# Patient Record
Sex: Female | Born: 1955 | Race: White | Hispanic: No | Marital: Married | Smoking: Never smoker
Health system: Southern US, Community
[De-identification: ages and names within clinical notes are randomized; demographics above are authoritative.]

## PROBLEM LIST (undated history)

## (undated) DIAGNOSIS — M810 Age-related osteoporosis without current pathological fracture: Secondary | ICD-10-CM

---

## 2006-06-18 ENCOUNTER — Other Ambulatory Visit: Payer: Self-pay

## 2006-06-18 ENCOUNTER — Emergency Department: Payer: Self-pay | Admitting: Unknown Physician Specialty

## 2009-08-23 ENCOUNTER — Ambulatory Visit: Payer: Self-pay | Admitting: Family Medicine

## 2009-08-27 ENCOUNTER — Ambulatory Visit: Payer: Self-pay | Admitting: Family Medicine

## 2010-03-08 ENCOUNTER — Emergency Department: Payer: Self-pay | Admitting: Emergency Medicine

## 2011-08-18 ENCOUNTER — Ambulatory Visit: Payer: Self-pay | Admitting: Obstetrics and Gynecology

## 2011-10-24 IMAGING — CR DG CHEST 2V
1 series · 2 of 2 positions shown · non-contrast
Comparison: none

REASON FOR EXAM: shortness of breath, pleuritic CP after fall
COMMENTS:

[Series 1: view not recorded · 0.17mm/px · 2 of 2 slices shown]
[im 1/2]
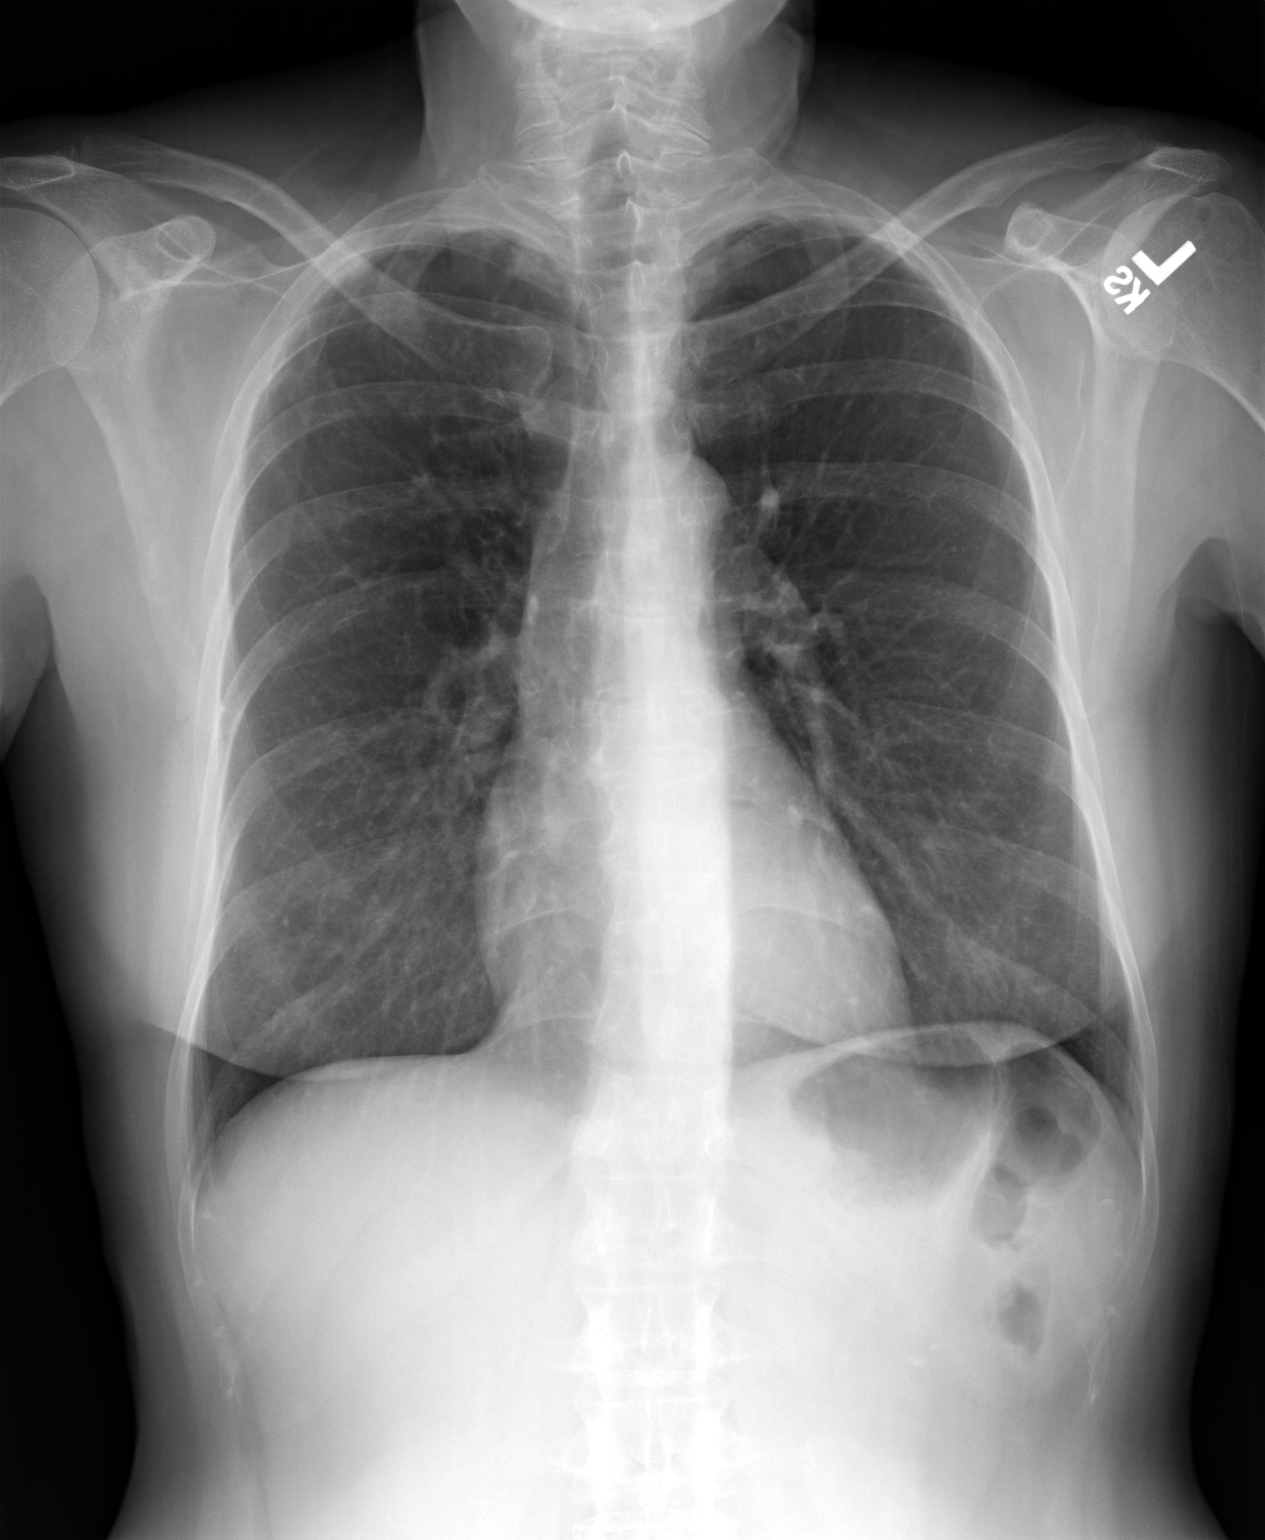
[im 2/2]
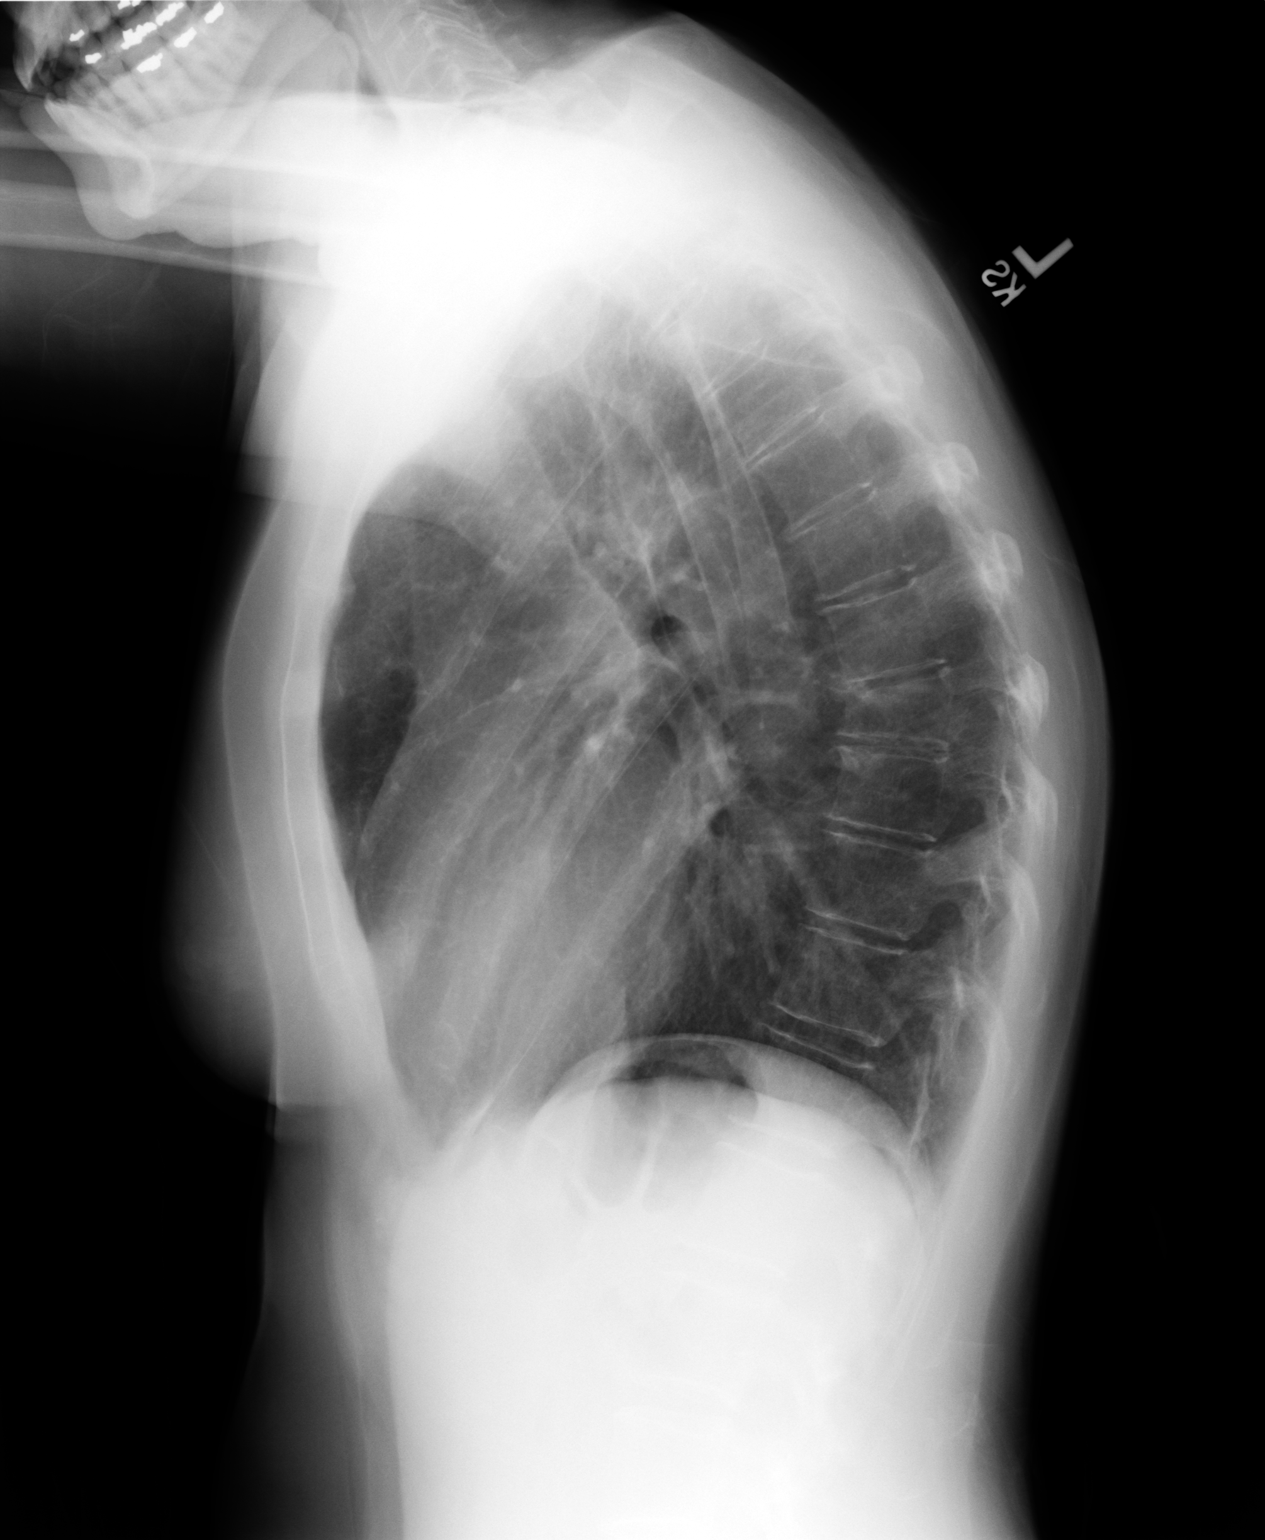

[2 of 2 positions shown; findings below may reference images not displayed]

PROCEDURE:     DXR - DXR CHEST PA (OR AP) AND LATERAL  - March 08, 2010  [DATE]

RESULT:     Two views of the chest are compared to the previous portable
view dated 06/18/2006. The lungs appear fully inflated and clear. There is no
edema, infiltrate, effusion or pneumothorax. The cardiac silhouette and
pulmonary vasculature appear to be normal. Apical fibrosis is present
bilaterally. There is anterior wedging of a midthoracic vertebral body
consistent with compression fracture. This appears to be possibly at the T8
or T7 level. MRI or CT are available for further evaluation if desired.
There does not appear to be significant retropulsion of the posterior wall.
IMPRESSION: 1.     Thoracic spine anterior wedging as described. No acute
cardiopulmonary disease evident.

## 2011-10-24 IMAGING — CT CT CHEST W/O CM
1 series · 16 of 33 positions shown, 20 images · non-contrast
Comparison: none

REASON FOR EXAM: T7 wedge compression fx, retrosternal air space
COMMENTS:

[Series 2: soft tissue · axial · 0.56mm/px · z∈[-326,-26]mm · 16 of 110 slices shown, 20 images]
[im 5/110  mediastinal]
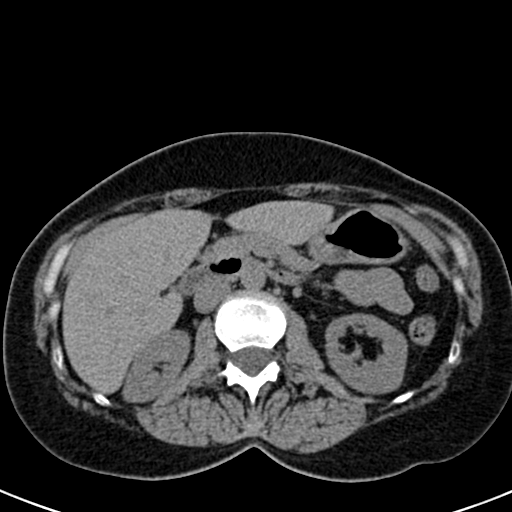
[im 5/110  lung]
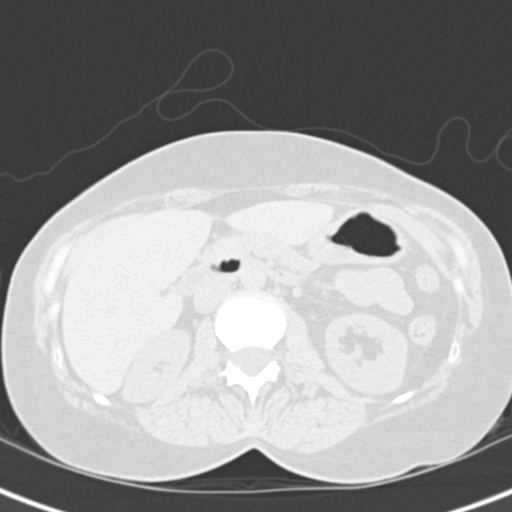
[im 13/110  lung]
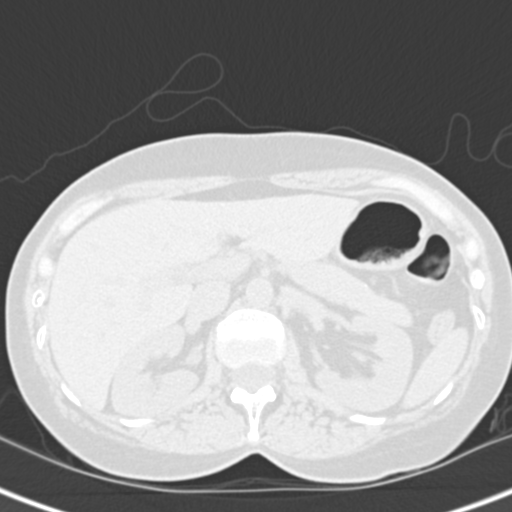
[im 21/110  lung]
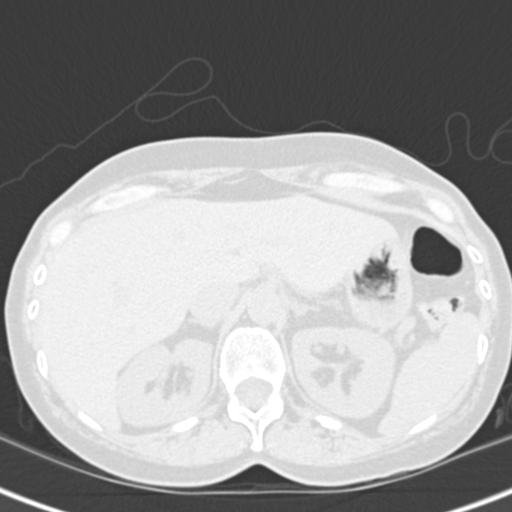
[im 25/110  lung]
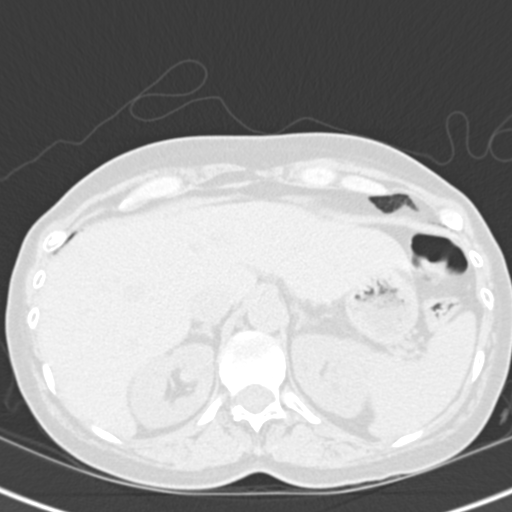
[im 33/110  mediastinal]
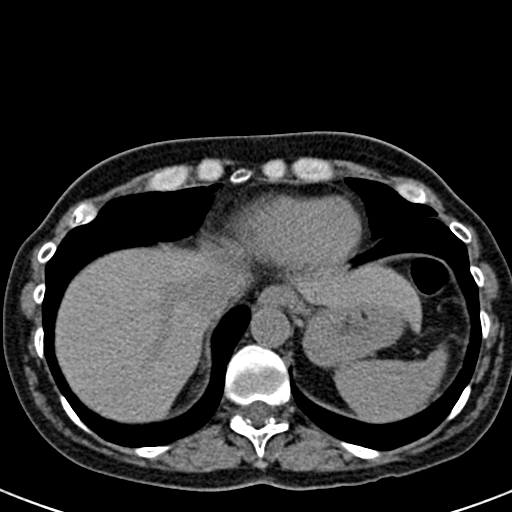
[im 33/110  lung]
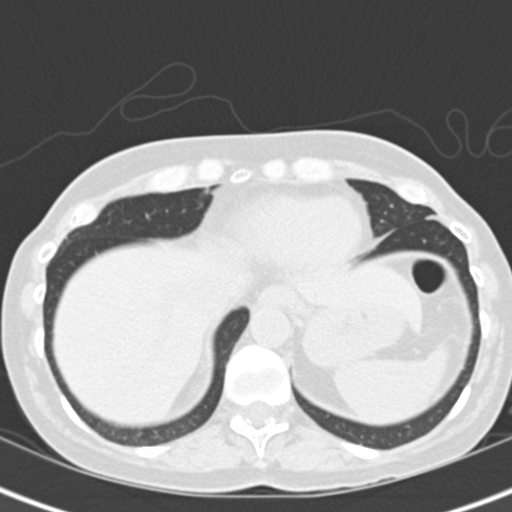
[im 41/110  lung]
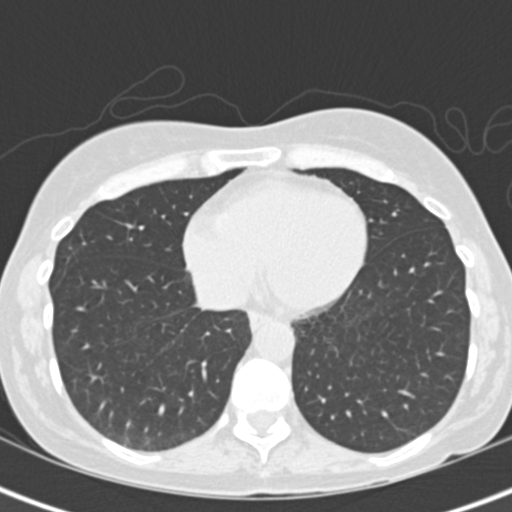
[im 45/110  lung]
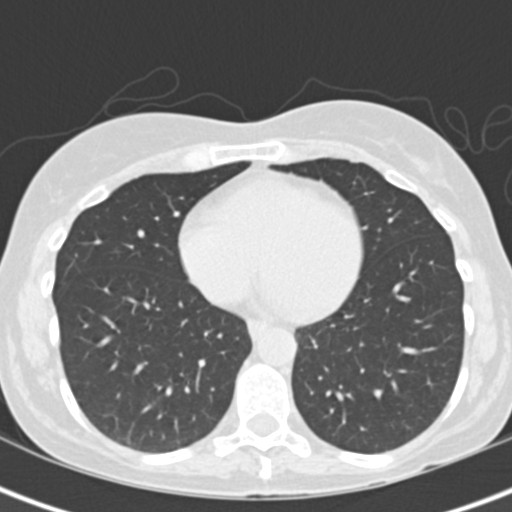
[im 53/110  lung]
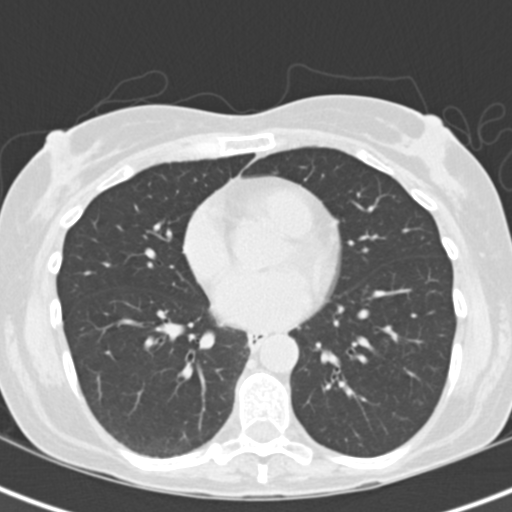
[im 58/110  mediastinal]
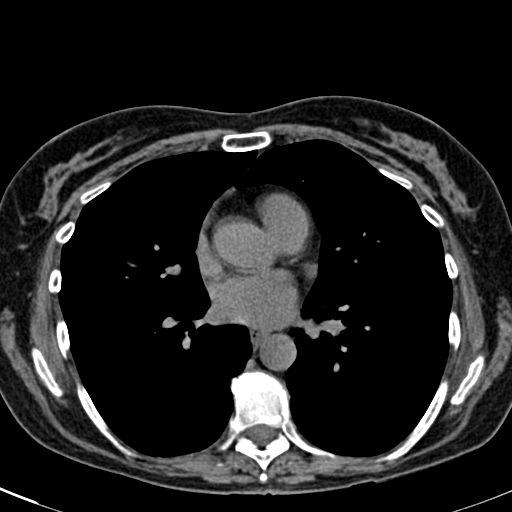
[im 58/110  lung]
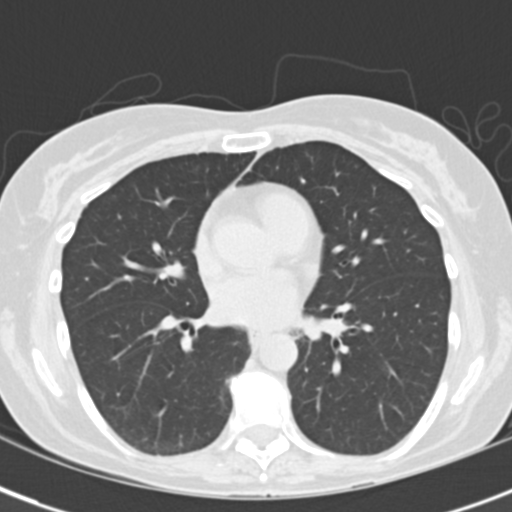
[im 65/110  lung]
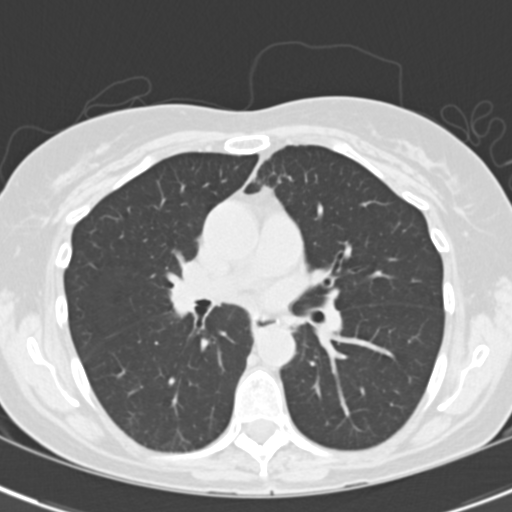
[im 69/110  lung]
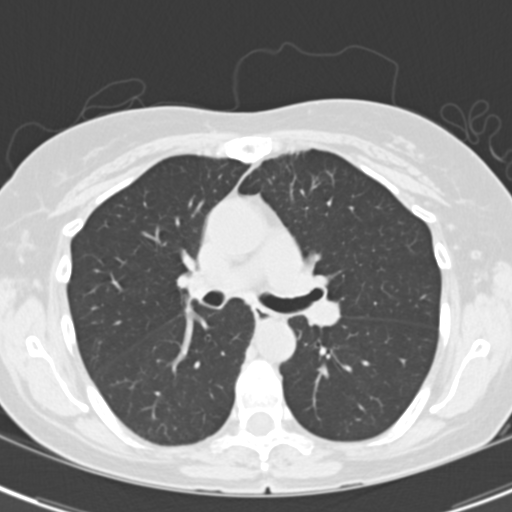
[im 77/110  lung]
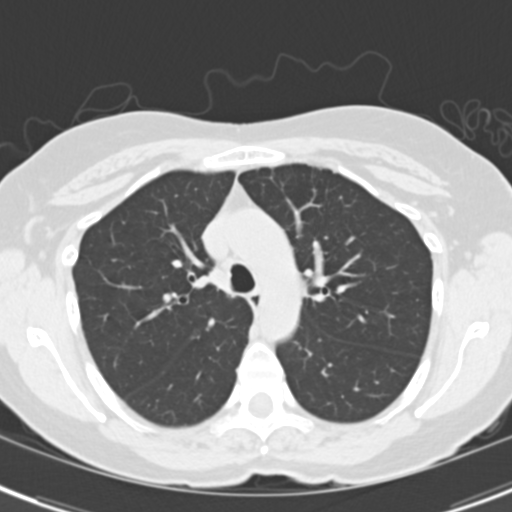
[im 85/110  mediastinal]
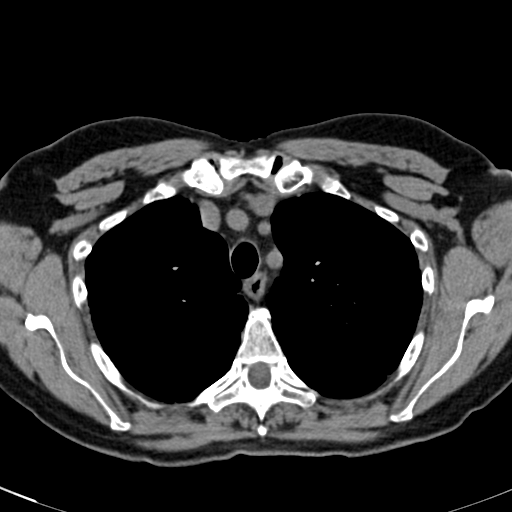
[im 85/110  lung]
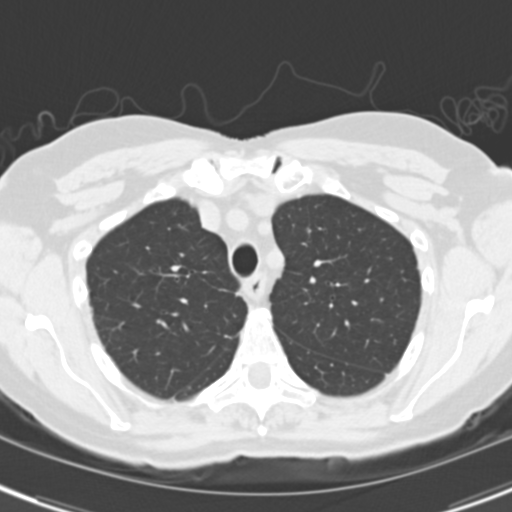
[im 89/110  lung]
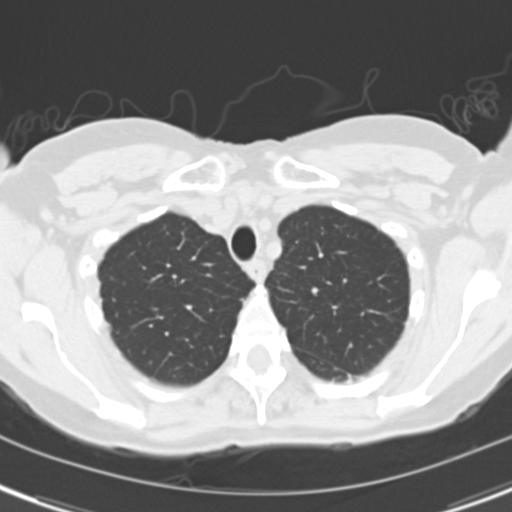
[im 97/110  lung]
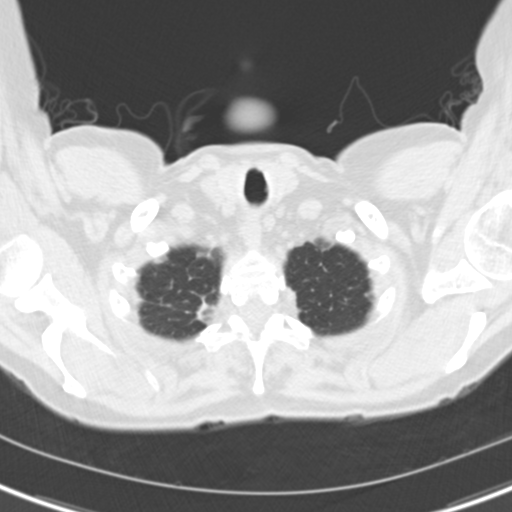
[im 105/110  lung]
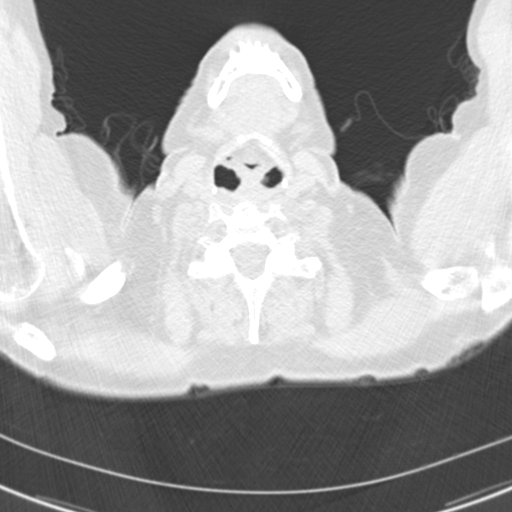

[16 of 33 positions shown; findings below may reference images not displayed]

PROCEDURE:     CT  - CT CHEST WITHOUT CONTRAST  - March 08, 2010  [DATE]

RESULT:     Noncontrast emergent CT of the chest is performed with
reconstructions in the axial plane at 3 mm slice thickness. There is no
previous exam for comparison.

Apical densities most likely represent fibrosis. No pneumothorax is evident.
No pulmonary contusion is appreciated. Webspace reconstructions demonstrate
anterior wedging of the T8 vertebral body without retropulsion of any bony
fragments. Further investigation with CT of the thoracic spine can be
performed. Otherwise the vertebral body heights and intervertebral disc
spaces appear to be maintained. There is no mediastinal or hilar mass. The
thyroid lobes are unremarkable. The included upper abdominal structures
demonstrate left nephrolithiasis with a nonobstructing left mid renal
calculus measuring up to 6.8 mm on image 94.
IMPRESSION: 1. No acute cardiopulmonary disease.
2. T8 compression fracture.

## 2011-10-24 IMAGING — CT CT THORACIC SPINE WITHOUT CONTRAST
2 series · 13 of 27 positions shown, 16 images · non-contrast
Comparison: none

REASON FOR EXAM: T7 wedge compression fx, retrosternal air space
COMMENTS:

[Series 6: tspine · axial · 0.37mm/px · z∈[-314,-38]mm · 8 of 110 slices shown, 10 images]
[im 9/110  soft-tissue]
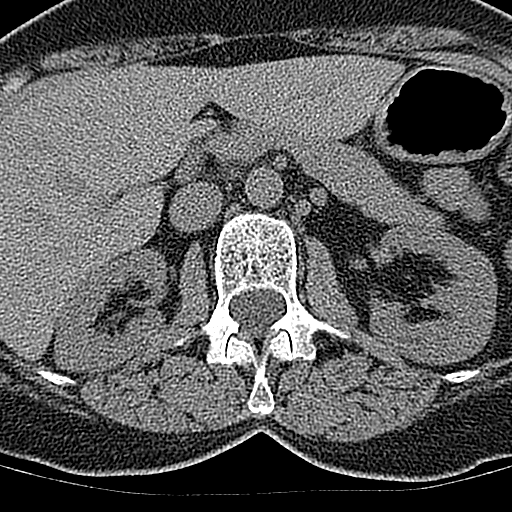
[im 9/110  bone]
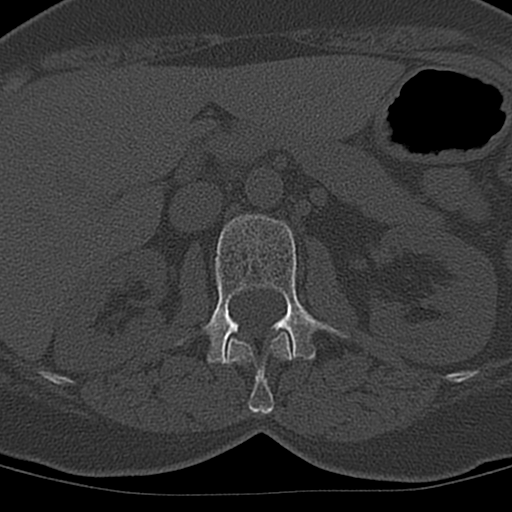
[im 26/110  bone]
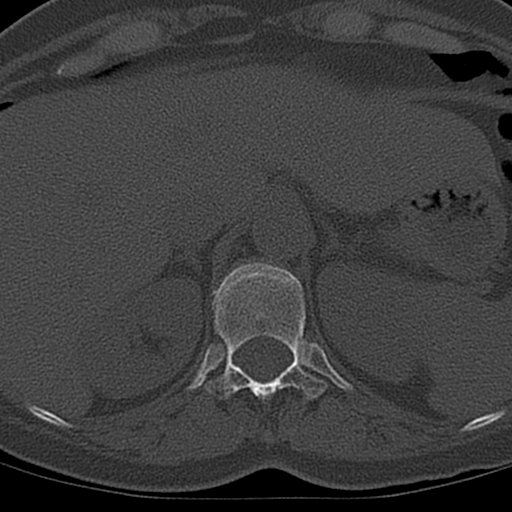
[im 34/110  bone]
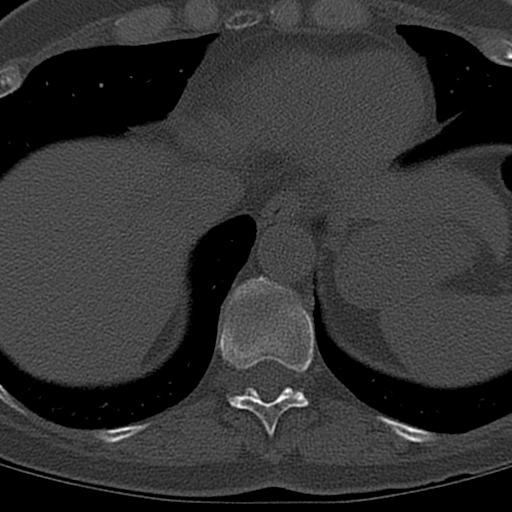
[im 51/110  bone]
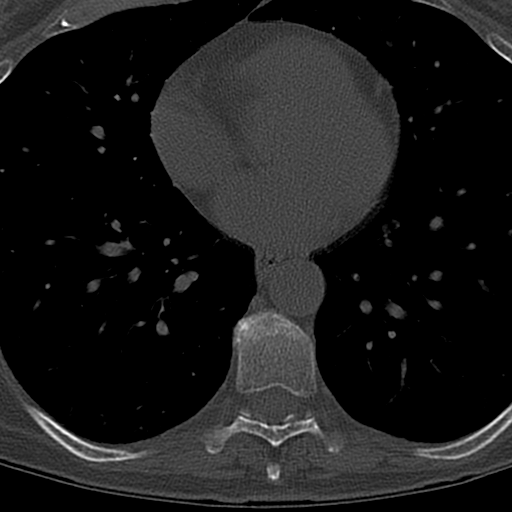
[im 59/110  soft-tissue]
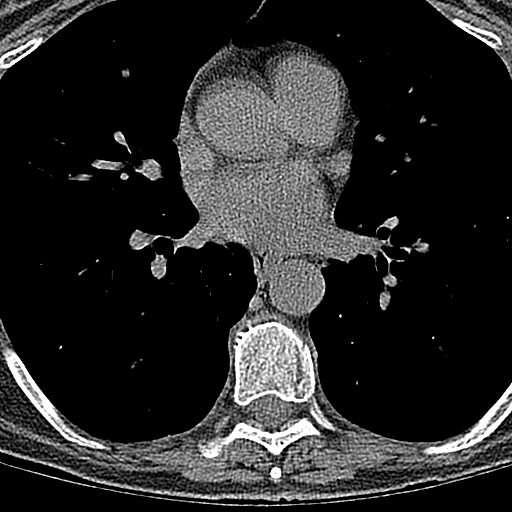
[im 59/110  bone]
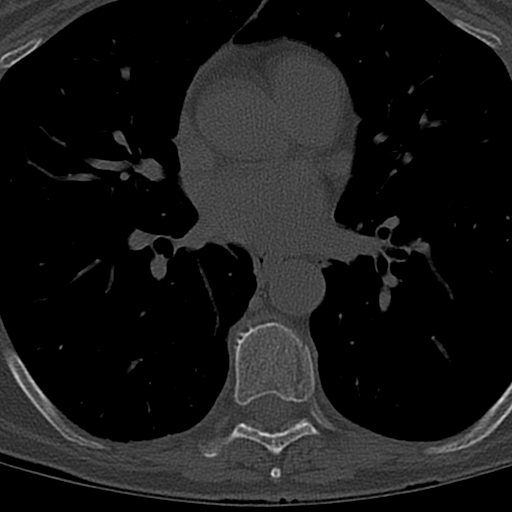
[im 76/110  bone]
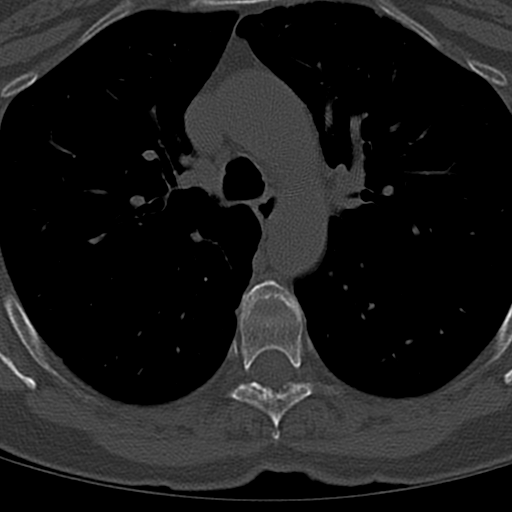
[im 84/110  bone]
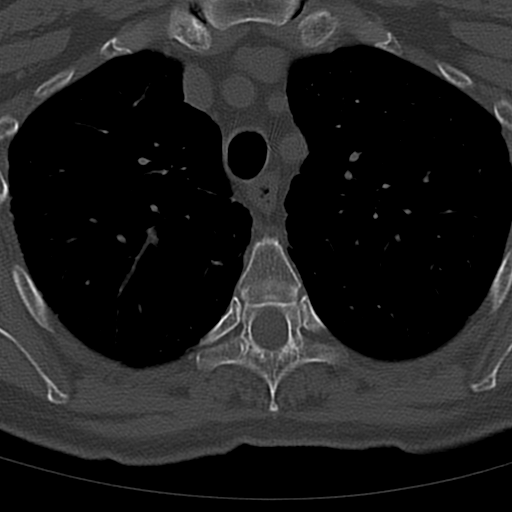
[im 101/110  bone]
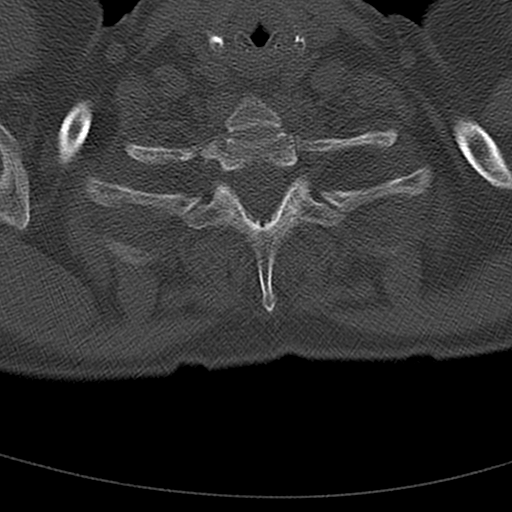

[Series 8: tspine sag · sagittal · 0.38mm/px · 5 of 37 slices shown, 6 images]
[im 13/37  bone]
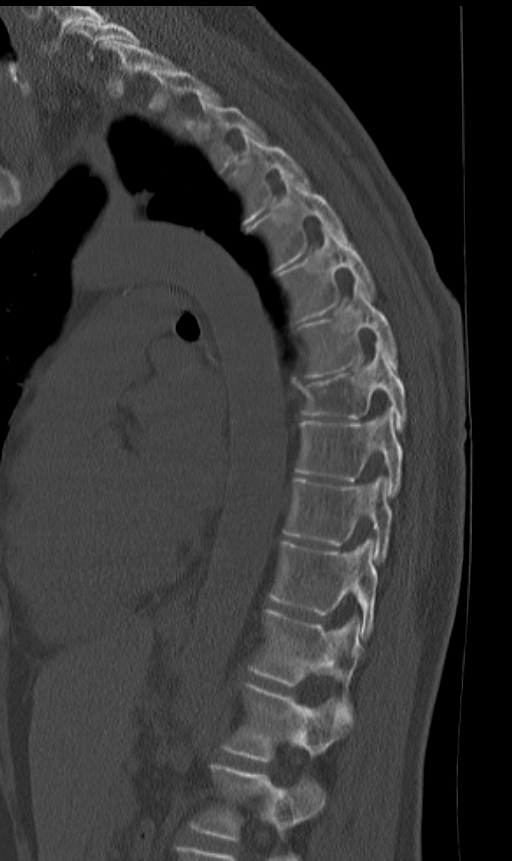
[im 16/37  bone]
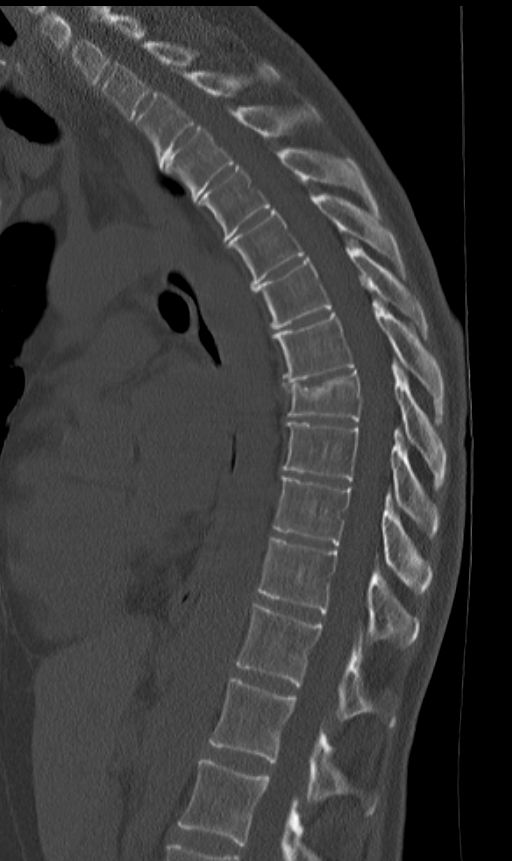
[im 19/37  soft-tissue]
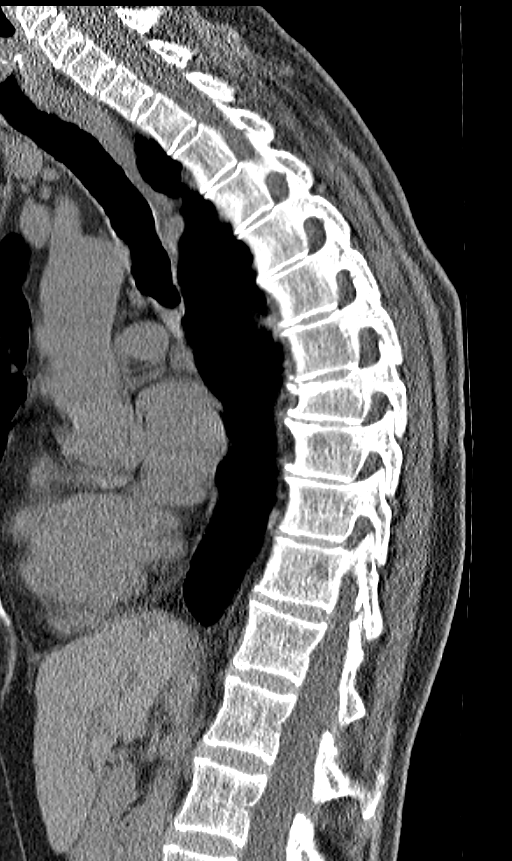
[im 19/37  bone]
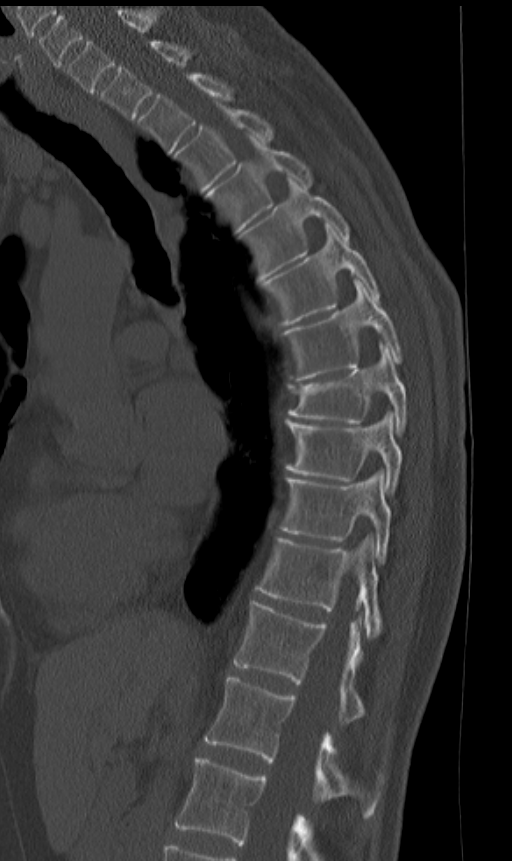
[im 22/37  bone]
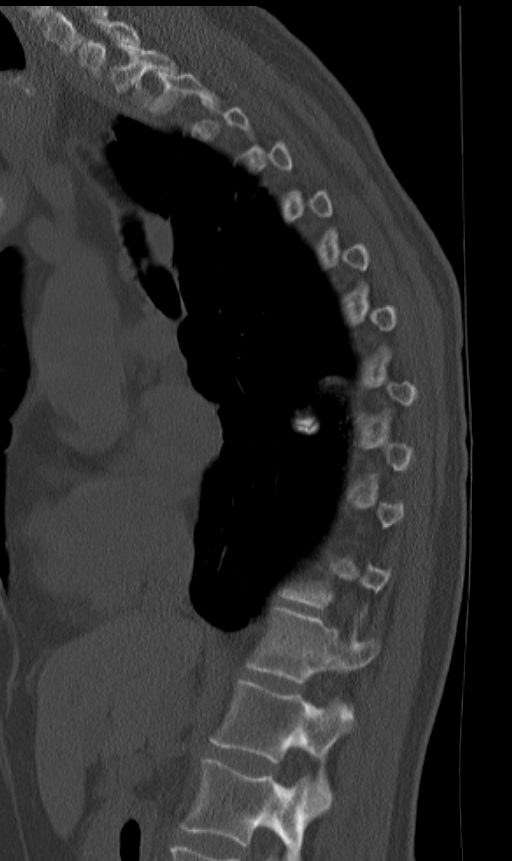
[im 25/37  bone]
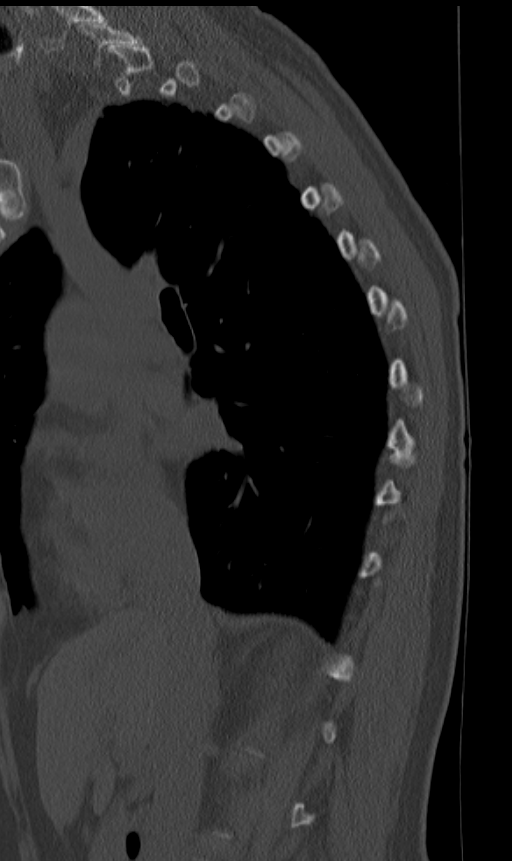

[13 of 27 positions shown; findings below may reference images not displayed]

PROCEDURE:     CT  - CT THORACIC SPINE WO  - March 08, 2010  [DATE]

RESULT:     Axial, sagittal and coronal bone window 3 mm reconstructions
from a CT of the spine in the thoracic region demonstrate anterior wedging
of the T8 vertebral body. This appears to be acute. Minimal retropulsion is
seen along the left side posteriorly without severe spinal canal or
foraminal stenosis appreciated. The included ribs appear intact. The
posterior elements appear to be intact as well.
IMPRESSION: Acute T8 compression fracture without significant evidence
of bony encroachment on the spinal canal.

## 2020-05-21 DIAGNOSIS — M810 Age-related osteoporosis without current pathological fracture: Secondary | ICD-10-CM | POA: Diagnosis not present

## 2020-05-21 DIAGNOSIS — E559 Vitamin D deficiency, unspecified: Secondary | ICD-10-CM | POA: Diagnosis not present

## 2020-12-10 ENCOUNTER — Encounter: Payer: Self-pay | Admitting: Emergency Medicine

## 2020-12-10 ENCOUNTER — Ambulatory Visit
Admission: EM | Admit: 2020-12-10 | Discharge: 2020-12-10 | Disposition: A | Discharge status: Home / Self Care | Payer: BC Managed Care – PPO | Attending: Emergency Medicine | Admitting: Emergency Medicine

## 2020-12-10 ENCOUNTER — Other Ambulatory Visit: Payer: Self-pay

## 2020-12-10 DIAGNOSIS — Z20822 Contact with and (suspected) exposure to covid-19: Secondary | ICD-10-CM | POA: Insufficient documentation

## 2020-12-10 DIAGNOSIS — J029 Acute pharyngitis, unspecified: Secondary | ICD-10-CM | POA: Insufficient documentation

## 2020-12-10 HISTORY — DX: Age-related osteoporosis without current pathological fracture: M81.0

## 2020-12-10 LAB — GROUP A STREP BY PCR: Group A Strep by PCR: NOT DETECTED

## 2020-12-10 LAB — SARS CORONAVIRUS 2 (TAT 6-24 HRS): SARS Coronavirus 2: NEGATIVE

## 2020-12-10 MED ORDER — FLUTICASONE PROPIONATE 50 MCG/ACT NA SUSP: 2.0000 | Freq: Every day | NASAL | 0 refills | Status: AC

## 2020-12-10 NOTE — ED Provider Notes (Signed)
HPI  SUBJECTIVE:  Patient reports sore throat, fatigue, anorexia starting 6 days ago.  Sx worse with nothing.  No alleviating factors.  She has tried Sudafed, NyQuil, DayQuil, and allergy medications. No fever   No neck stiffness  No Cough + Nasal congestion, postnasal drip No Myalgias No Headache No Rash  No loss of taste or smell No shortness of breath or difficulty breathing No nausea, vomiting No diarrhea No abdominal pain     No Recent Strep, mono, flu, COVID exposure No reflux sxs No Allergy sxs  No Breathing difficulty, voice changes, sensation of throat swelling shut No Drooling No Trismus No abx in past month. She got the second COVID-vaccine No antipyretic in past 4-6 hrs  She has no past medical history.  PMD: None   Past Medical History:  Diagnosis Date   Osteoporosis     History reviewed. No pertinent surgical history.  History reviewed. No pertinent family history.  Social History   Tobacco Use   Smoking status: Never   Smokeless tobacco: Never  Vaping Use   Vaping Use: Never used  Substance Use Topics   Alcohol use: Not Currently   Drug use: Not Currently    No current facility-administered medications for this encounter.  Current Outpatient Medications:    alendronate (FOSAMAX) 70 MG tablet, Take 70 mg by mouth once a week., Disp: , Rfl:    ergocalciferol (VITAMIN D2) 1.25 MG (50000 UT) capsule, Take by mouth., Disp: , Rfl:    fluticasone (FLONASE) 50 MCG/ACT nasal spray, Place 2 sprays into both nostrils daily., Disp: 16 g, Rfl: 0  No Known Allergies   ROS  As noted in HPI.   Physical Exam  BP (!) 164/86 (BP Location: Left Arm)   Pulse 80   Temp 98.5 F (36.9 C) (Oral)   Resp 18   Ht 5\' 2"  (1.575 m)   Wt 54.4 kg   SpO2 98%   BMI 21.95 kg/m   Constitutional: Well developed, well nourished, no acute distress Eyes:  EOMI, conjunctiva normal bilaterally HENT: Normocephalic, atraumatic,mucus membranes moist. + mild nasal  congestion, erythematous, but not swollen mucosa.  Erythematous oropharynx.  Tonsils normal size without exudates, uvula midline.  Positive postnasal drip.  Respiratory: Normal inspiratory effort Cardiovascular: Normal rate, no murmurs, rubs, gallops GI: nondistended, nontender. No appreciable splenomegaly skin: No rash, skin intact Lymph: No anterior cervical LN.  No posterior cervical lymphadenopathy Musculoskeletal: no deformities Neurologic: Alert & oriented x 3, no focal neuro deficits Psychiatric: Speech and behavior appropriate.  ED Course   Medications - No data to display  Orders Placed This Encounter  Procedures   Group A Strep by PCR    Standing Status:   Standing    Number of Occurrences:   1   SARS CORONAVIRUS 2 (TAT 6-24 HRS) Nasopharyngeal Nasopharyngeal Swab    Standing Status:   Standing    Number of Occurrences:   1    Order Specific Question:   Is this test for diagnosis or screening    Answer:   Diagnosis of ill patient    Order Specific Question:   Symptomatic for COVID-19 as defined by CDC    Answer:   Yes    Order Specific Question:   Date of Symptom Onset    Answer:   12/05/2020    Order Specific Question:   Hospitalized for COVID-19    Answer:   No    Order Specific Question:   Admitted to ICU for COVID-19  Answer:   No    Order Specific Question:   Previously tested for COVID-19    Answer:   No    Order Specific Question:   Resident in a congregate (group) care setting    Answer:   No    Order Specific Question:   Employed in healthcare setting    Answer:   No    Order Specific Question:   Pregnant    Answer:   No    Order Specific Question:   Has patient completed COVID vaccination(s) (2 doses of Pfizer/Moderna 1 dose of [employer])    Answer:   Yes    Order Specific Question:   Has patient completed COVID Booster / 3rd dose    Answer:   No    Results for orders placed or performed during the hospital encounter of 12/10/20 (from the  past 24 hour(s))  Group A Strep by PCR     Status: None   Collection Time: 12/10/20 12:56 PM   Specimen: Nasopharyngeal Swab; Sterile Swab  Result Value Ref Range   Group A Strep by PCR NOT DETECTED NOT DETECTED   No results found.  ED Clinical Impression  1. Acute pharyngitis, unspecified etiology   2. Encounter for laboratory testing for COVID-19 virus     ED Assessment/Plan  COVID, strep PCR sent.  Unfortunately, she is out of the antiviral treatment window for COVID.  Think that Mono is very unlikely given her age and physical findings.  We talked about doing a mono screen, but decided to not do so. will contact patient [phone] with abnormal results.  Strep PCR negative.  COVID pending at the time of signing of this note  In the meantime, Flonase, Mucinex, advised her to be careful with the decongestants as it did elevate her blood pressure today, saline nasal irrigation with a Lloyd Huger Med rinse and distilled water as often as she wants, Benadryl/Maalox mixture.  Will provide primary care list and order assistance in finding a PMD.  Discussed labs,  MDM, plan and followup with patient. . patient agrees with plan.   Meds ordered this encounter  Medications   fluticasone (FLONASE) 50 MCG/ACT nasal spray    Sig: Place 2 sprays into both nostrils daily.    Dispense:  16 g    Refill:  0     *This clinic note was created using Scientist, clinical (histocompatibility and immunogenetics). Therefore, there may be occasional mistakes despite careful proofreading.     Domenick Gong, MD 12/10/20 1409

## 2020-12-10 NOTE — ED Triage Notes (Signed)
Pt c/o nasal congestion, sore throat, chills, and fatigue. Started about 5 days ago. She had a home covid test and was negative.

## 2020-12-10 NOTE — Discharge Instructions (Addendum)
I will contact you only if your strep or COVID come back positive.  Strep will be back in about an hour, COVID will be back tomorrow.  Do not hear from [place], then you can assume that they are both negative.  1 gram of Tylenol and 600 mg ibuprofen together 3-4 times a day as needed for pain.  Make sure you drink plenty of extra fluids.  Some people find salt water gargles and  Traditional Medicinal's "Throat Coat" tea helpful. Take 5 mL of liquid Benadryl and 5 mL of Maalox. Mix it together, and then hold it in your mouth for as long as you can and then swallow. You may do this 4 times a day.  Flonase, saline nasal irrigation with a NeilMed sinus rinse and distilled water as often as you want.  May take Mucinex or Mucinex D as long as your blood pressure tolerates it.  Here is a list of primary care providers who are taking new patients:  Dr. Elizabeth Sauer [address] [place] [address] [phone]  [facility] [address] [place], [place] [address] [phone]  [facility] [place] [address] [place] [address]  [phone]  [facility] [address] [place], [place] [address] [phone]  [facility] [address] [place]  [phone] [place], [place] [address]  Here are clinics/ other resources who will see you if you do not have insurance. Some have certain criteria that you must meet. Call them and find out what they are:  [facility]: [address] [place], [place] [address] Phone: [phone] Hours: First and Third Saturdays of each Month, 9 a.m. - 1 p.m.  [facility]: [address], [address] [place], [place] [address] Phone: [phone] Hours: Tuesday, 4 p.m. - 8 p.m. Thursday, 1 p.m. - 8 p.m. Wednesday, 9 a.m. - [facility] [address], [place], [place] [address] Phone: [phone] Pharmacy Phone Number: [phone] Dental Phone Number: [phone] [facility] Insurance  Help: [phone]  Dental Hours: Monday - Thursday, 8 a.m. - 6 p.m.  [facility] [address] [place], [place] [address] Phone: [phone] Pharmacy Phone Number: [phone] [facility] Insurance Help: [phone]  [facility] [address] [place], [place] [address] Phone: [phone] Pharmacy Phone Number: [phone] [facility] Insurance Help: [phone]  [facility] [address] [place], [place] [address] Phone: [phone] [facility] Insurance Help: [phone]   [facility] [address] [place], [place] [address] Phone: [phone]  Go to www.goodrx.com  or www.costplusdrugs.com to look up your medications. This will give you a list of where you can find your prescriptions at the most affordable prices. Or ask the pharmacist what the cash price is, or if they have any other discount programs available to help make your medication more affordable. This can be less expensive than what you would pay with insurance.

## 2021-03-14 ENCOUNTER — Ambulatory Visit: Payer: Self-pay | Admitting: Family Medicine

## 2021-05-27 DIAGNOSIS — M810 Age-related osteoporosis without current pathological fracture: Secondary | ICD-10-CM | POA: Diagnosis not present

## 2021-06-10 DIAGNOSIS — M810 Age-related osteoporosis without current pathological fracture: Secondary | ICD-10-CM | POA: Diagnosis not present

## 2021-06-16 ENCOUNTER — Ambulatory Visit: Payer: BC Managed Care – PPO | Admitting: Family Medicine

## 2021-06-16 ENCOUNTER — Encounter: Payer: Self-pay | Admitting: Family Medicine

## 2021-06-16 VITALS — BP 118/84 | HR 64 | Ht 62.0 in | Wt 123.0 lb

## 2021-06-16 DIAGNOSIS — Z124 Encounter for screening for malignant neoplasm of cervix: Secondary | ICD-10-CM | POA: Diagnosis not present

## 2021-06-16 DIAGNOSIS — R030 Elevated blood-pressure reading, without diagnosis of hypertension: Secondary | ICD-10-CM

## 2021-06-16 DIAGNOSIS — Z1211 Encounter for screening for malignant neoplasm of colon: Secondary | ICD-10-CM | POA: Diagnosis not present

## 2021-06-16 NOTE — Progress Notes (Signed)
? ? ?Date:  06/16/2021  ? ?Name:  Julia Washington   DOB:  [birth year]   MRN:  [id number] ? ? ?Chief Complaint: Establish Care (Just needs pcp when getting sick) ? ?Patient is a 66 year old female who presents for a establish care exam. The patient reports the following problems: elevated blood pressure. Health maintenance has been reviewed colonoscopy/ mammogram. ?  ? ? ?No results found for: NA, K, CO2, GLUCOSE, BUN, CREATININE, CALCIUM, EGFR, GFRNONAA, GLUCOSE ?No results found for: CHOL, HDL, LDLCALC, LDLDIRECT, TRIG, CHOLHDL ?No results found for: TSH ?No results found for: HGBA1C ?No results found for: WBC, HGB, HCT, MCV, PLT ?No results found for: ALT, AST, GGT, ALKPHOS, BILITOT ?No results found for: 25OHVITD2, [place], VD25OH  ? ?Review of Systems ? ?There are no problems to display for this patient. ? ? ?No Known Allergies ? ?History reviewed. No pertinent surgical history. ? ?Social History  ? ?Tobacco Use  ? Smoking status: Never  ? Smokeless tobacco: Never  ?Vaping Use  ? Vaping Use: Never used  ?Substance Use Topics  ? Alcohol use: Not Currently  ? Drug use: Not Currently  ? ? ? ?Medication list has been reviewed and updated. ? ?Current Meds  ?Medication Sig  ? alendronate (FOSAMAX) 70 MG tablet Take 70 mg by mouth once a week. [place]  ? Calcium Carbonate-Vit D-Min (CALCIUM 1200 PO) Take 1 tablet by mouth daily.  ? ergocalciferol (VITAMIN D2) 1.25 MG (50000 UT) capsule Take by mouth.  ? fluticasone (FLONASE) 50 MCG/ACT nasal spray Place 2 sprays into both nostrils daily.  ? ? ? ?  06/16/2021  ?  2:22 PM  ?GAD 7 : Generalized Anxiety Score  ?Nervous, Anxious, on Edge 0  ?Control/stop worrying 0  ?Worry too much - different things 0  ?Trouble relaxing 0  ?Restless 0  ?Easily annoyed or irritable 0  ?Afraid - awful might happen 0  ?Total GAD 7 Score 0  ?Anxiety Difficulty Not difficult at all  ? ? ? ?  06/16/2021  ?  2:22 PM  ?Depression screen PHQ 2/9  ?Decreased Interest 0  ?Down, Depressed, Hopeless 0  ?PHQ  - 2 Score 0  ?Altered sleeping 0  ?Tired, decreased energy 0  ?Change in appetite 0  ?Feeling bad or failure about yourself  0  ?Trouble concentrating 0  ?Moving slowly or fidgety/restless 0  ?Suicidal thoughts 0  ?PHQ-9 Score 0  ?Difficult doing work/chores Not difficult at all  ? ? ?BP Readings from Last 3 Encounters:  ?06/16/21 (!) 138/100  ?12/10/20 (!) 164/86  ? ? ?Physical Exam ?Vitals and nursing note reviewed.  ?Constitutional:   ?   Appearance: She is well-developed.  ?HENT:  ?   Head: Normocephalic.  ?   Right Ear: External ear normal.  ?   Left Ear: External ear normal.  ?Eyes:  ?   General: Lids are everted, no foreign bodies appreciated. No scleral icterus.    ?   Left eye: No foreign body or hordeolum.  ?   Conjunctiva/sclera: Conjunctivae normal.  ?   Right eye: Right conjunctiva is not injected.  ?   Left eye: Left conjunctiva is not injected.  ?   Pupils: Pupils are equal, round, and reactive to light.  ?Neck:  ?   Thyroid: No thyromegaly.  ?   Vascular: No JVD.  ?   Trachea: No tracheal deviation.  ?Cardiovascular:  ?   Rate and Rhythm: Normal rate and regular rhythm.  ?  Heart sounds: Normal heart sounds, S1 normal and S2 normal. No murmur heard. ?No systolic murmur is present.  ?No diastolic murmur is present.  ?  No friction rub. No gallop. No S3 or S4 sounds.  ?Pulmonary:  ?   Effort: Pulmonary effort is normal. No respiratory distress.  ?   Breath sounds: Normal breath sounds. No wheezing, rhonchi or rales.  ?Abdominal:  ?   General: Bowel sounds are normal.  ?   Palpations: Abdomen is soft. There is no mass.  ?   Tenderness: There is no abdominal tenderness. There is no guarding or rebound.  ?Musculoskeletal:     ?   General: No tenderness. Normal range of motion.  ?   Cervical back: Normal range of motion and neck supple.  ?Lymphadenopathy:  ?   Cervical: No cervical adenopathy.  ?Skin: ?   General: Skin is warm.  ?   Findings: No rash.  ?Neurological:  ?   Mental Status: She is alert and  oriented to person, place, and time.  ?   Cranial Nerves: No cranial nerve deficit.  ?   Deep Tendon Reflexes: Reflexes normal.  ?Psychiatric:     ?   Mood and Affect: Mood is not anxious or depressed.  ? ? ?Wt Readings from Last 3 Encounters:  ?06/16/21 123 lb (55.8 kg)  ?12/10/20 120 lb (54.4 kg)  ? ? ?BP (!) 138/100   Pulse 64   Ht '5\' 2"'  (1.575 m)   Wt 123 lb (55.8 kg)   BMI 22.50 kg/m?  ? ?Assessment and Plan: ? ?1. Elevated blood pressure reading in office without diagnosis of hypertension ?Chronic.  Controlled.  Stable.  Initial reading was elevated and previous readings have been elevated but normal in other offices.  Repeat today had a normal blood pressure reading 118/84 and we will continue with a dietary approach using the Dash diet or further concern. ? ?2. Colon cancer screening ?Discussed with patient importance of screening for colon cancer.  We discussed Cologuard, FIT testing, and colonoscopy and patient may be leaning more towards colonoscopy since this is a procedure that could not wait to 10 years before another repeat.  Patient is to call her insurance company to determine what her options would be best and we will pursue this referral for her. ? ?3. Cervical cancer screening ?Chronic.  Controlled.  Stable.  Previous history of being followed by Dr. Georgianne Fick in 2013.  If they do not return her to the clinic patient is to call and we will proceed with referral to [facility] or to. ?- Ambulatory referral to Gynecology  ? ? ?

## 2021-06-16 NOTE — Patient Instructions (Signed)

## 2021-06-23 ENCOUNTER — Telehealth: Payer: Self-pay

## 2021-06-23 NOTE — Telephone Encounter (Signed)
-----   Message from Darrel Hoover, RN sent at 06/19/2021  9:01 AM EDT ----- Regarding: referral Schedule with PA or CNM for annual exam including pap - 4-6 weeks.

## 2021-06-23 NOTE — Telephone Encounter (Signed)
[facility] referring for annual exam including pap smear . Sch with CMN or PA Called and left voicemail for patient to call back to be scheduled.

## 2021-06-25 NOTE — Telephone Encounter (Signed)
Patient is scheduled for 08/20/21 w LMD

## 2021-08-19 DIAGNOSIS — M810 Age-related osteoporosis without current pathological fracture: Secondary | ICD-10-CM | POA: Diagnosis not present

## 2021-08-20 ENCOUNTER — Encounter: Payer: Self-pay | Admitting: Licensed Practical Nurse

## 2021-08-25 DIAGNOSIS — M810 Age-related osteoporosis without current pathological fracture: Secondary | ICD-10-CM | POA: Diagnosis not present

## 2021-08-26 ENCOUNTER — Encounter: Payer: Self-pay | Admitting: Licensed Practical Nurse

## 2021-10-22 ENCOUNTER — Ambulatory Visit (INDEPENDENT_AMBULATORY_CARE_PROVIDER_SITE_OTHER): Payer: BC Managed Care – PPO | Admitting: Licensed Practical Nurse

## 2021-10-22 VITALS — BP 122/70 | Ht 62.0 in | Wt 122.0 lb

## 2021-10-22 DIAGNOSIS — Z124 Encounter for screening for malignant neoplasm of cervix: Secondary | ICD-10-CM | POA: Diagnosis not present

## 2021-10-22 DIAGNOSIS — Z01419 Encounter for gynecological examination (general) (routine) without abnormal findings: Secondary | ICD-10-CM

## 2021-10-22 NOTE — Progress Notes (Signed)
Gynecology Annual Exam  PCP: Duanne Limerick, MD  Chief Complaint:  Chief Complaint  Patient presents with  . Annual Exam    History of Present Illness:Patient is a 66 y.o. No obstetric history on file. presents for annual exam. The patient has no complaints today.   LMP: No LMP recorded. Patient is postmenopausal. Menarche:{numbers 0-25:42706} Average Interval: {Desc; regular/irreg:14544}, {numbers 22-35:14824} days Duration of flow: {numbers; 0-10:33138} days Heavy Menses: {yes/no:63} Clots: {yes/no:63} Intermenstrual Bleeding: {yes/no:63} Postcoital Bleeding: {yes/no:63} Dysmenorrhea: {yes/no:63}  The patient {sys sexually active:13135} sexually active. She {has/denies:315300} dyspareunia.  The patient {DOES_DOES CBJ:62831} perform self breast exams.  There {is/is no:19420} notable family history of breast or ovarian cancer in her family.  The patient wears seatbelts: {yes/no:63}.   The patient has regular exercise: {yes/no/not asked:9010}.    The patient {Blank single:19197::"reports","denies"} current symptoms of depression.     Review of Systems: ROS  Past Medical History:  There are no problems to display for this patient.   Past Surgical History:  No past surgical history on file.  Gynecologic History:  No LMP recorded. Patient is postmenopausal. Last Pap: Results were: *** {Findings; lab pap smear results:16707::"NIL and HR HPV+","NIL and HR HPV negative"}  Last mammogram: *** Results were: {Blank single:19197::"***","BI-RADS IV","BI-RAD III","BI-RAD II","BI-RAD I"}  Obstetric History: No obstetric history on file.  Family History:  Family History  Problem Relation Age of Onset  . Cancer Mother   . Cancer Father   . Cancer Sister   . Stroke Maternal Grandmother   . Heart disease Paternal Grandfather     Social History:  Social History   Socioeconomic History  . Marital status: Married    Spouse name: Not on file  . Number of children: Not on  file  . Years of education: Not on file  . Highest education level: Not on file  Occupational History  . Not on file  Tobacco Use  . Smoking status: Never  . Smokeless tobacco: Never  Vaping Use  . Vaping Use: Never used  Substance and Sexual Activity  . Alcohol use: Not Currently  . Drug use: Not Currently  . Sexual activity: Yes  Other Topics Concern  . Not on file  Social History Narrative  . Not on file   Social Determinants of Health   Financial Resource Strain: Not on file  Food Insecurity: Not on file  Transportation Needs: Not on file  Physical Activity: Not on file  Stress: Not on file  Social Connections: Not on file  Intimate Partner Violence: Not on file    Allergies:  No Known Allergies  Medications: Prior to Admission medications   Medication Sig Start Date End Date Taking? Authorizing Provider  Calcium Carbonate-Vit D-Min (CALCIUM 1200 PO) Take 1 tablet by mouth daily.   Yes [provider]  ergocalciferol (VITAMIN D2) 1.25 MG (50000 UT) capsule Take by mouth.   Yes [provider]  fluticasone (FLONASE) 50 MCG/ACT nasal spray Place 2 sprays into both nostrils daily. 12/10/20  Yes Domenick Gong, MD    Physical Exam Vitals: Blood pressure 122/70, height 5\' 2"  (1.575 m), weight 122 lb (55.3 kg).  General: NAD HEENT: normocephalic, anicteric Thyroid: no enlargement, no palpable nodules Pulmonary: No increased work of breathing, CTAB Cardiovascular: RRR, distal pulses 2+ Breast: Breast symmetrical, no tenderness, no palpable nodules or masses, no skin or nipple retraction present, no nipple discharge.  No axillary or supraclavicular lymphadenopathy. Abdomen: NABS, soft, non-tender, non-distended.  Umbilicus without lesions.  No hepatomegaly, splenomegaly or masses palpable. No evidence of hernia  Genitourinary:  External: Normal external female genitalia.  Normal urethral meatus, normal Bartholin's and Skene's glands.    Vagina:  Normal vaginal mucosa, no evidence of prolapse.    Cervix: Grossly normal in appearance, no bleeding  Uterus: Non-enlarged, mobile, normal contour.  No CMT  Adnexa: ovaries non-enlarged, no adnexal masses  Rectal: deferred  Lymphatic: no evidence of inguinal lymphadenopathy Extremities: no edema, erythema, or tenderness Neurologic: Grossly intact Psychiatric: mood appropriate, affect full  Female chaperone present for pelvic and breast  portions of the physical exam     Assessment: 66 y.o. No obstetric history on file. routine annual exam  Plan: Problem List Items Addressed This Visit   None Visit Diagnoses     Well woman exam    -  Primary   Relevant Orders   MM DIGITAL SCREENING BILATERAL       1) Mammogram - recommend yearly screening mammogram.  Mammogram {Blank single:19197::"Is up to date","Was ordered today"}  2) STI screening  {Blank single:19197::"was","was not"}offered and {Blank single:19197::"accepted","declined","therefore not obtained"}  3) ASCCP guidelines and rational discussed.  Patient opts for {Blank single:19197::"***","every 5 years","every 3 years","yearly","discontinue age >65","discontinue secondary to prior hysterectomy"} screening interval  4) Osteoporosis  - per USPTF routine screening DEXA at age 61 - FRAX 10 year major fracture risk ***,  10 year hip fracture risk ***  Consider FDA-approved medical therapies in postmenopausal women and men aged 73 years and older, based on the following: a) A hip or vertebral (clinical or morphometric) fracture b) T-score = -2.5 at the femoral neck or spine after appropriate evaluation to exclude secondary causes C) Low bone mass (T-score between -1.0 and -2.5 at the femoral neck or spine) and a 10-year probability of a hip fracture = 3% or a 10-year probability of a major osteoporosis-related fracture = 20% based on the [place]-adapted WHO algorithm   5) Routine healthcare maintenance including cholesterol,  diabetes screening discussed {Blank single:19197::"managed by PCP","Ordered today","To return fasting at a later date","Declines"}  6) Colonoscopy ***.  Screening recommended starting at age 54 for average risk individuals, age 22 for individuals deemed at increased risk (including African Americans) and recommended to continue until age 5.  For patient age 22-85 individualized approach is recommended.  Gold standard screening is via colonoscopy, Cologuard screening is an acceptable alternative for patient unwilling or unable to undergo colonoscopy.  "Colorectal cancer screening for average-risk adults: 2018 guideline update from the American Cancer Society"CA: A Cancer Journal for Clinicians: Aug 12, 2016   7) No follow-ups on file.   Carie Caddy, CNM  Domingo Pulse, [facility] 10/22/2021, 2:53 PM

## 2021-10-23 ENCOUNTER — Other Ambulatory Visit (HOSPITAL_COMMUNITY)
Admission: RE | Admit: 2021-10-23 | Discharge: 2021-10-23 | Disposition: A | Discharge status: Home / Self Care | Payer: BC Managed Care – PPO | Source: Ambulatory Visit | Attending: Licensed Practical Nurse | Admitting: Licensed Practical Nurse

## 2021-10-23 DIAGNOSIS — Z124 Encounter for screening for malignant neoplasm of cervix: Secondary | ICD-10-CM | POA: Diagnosis not present

## 2021-10-23 DIAGNOSIS — Z01419 Encounter for gynecological examination (general) (routine) without abnormal findings: Secondary | ICD-10-CM | POA: Insufficient documentation

## 2021-10-28 LAB — CYTOLOGY - PAP
Chlamydia: NEGATIVE
Comment: NEGATIVE
Comment: NEGATIVE
Comment: NEGATIVE
Comment: NORMAL
Diagnosis: NEGATIVE
Diagnosis: REACTIVE
High risk HPV: NEGATIVE
Neisseria Gonorrhea: NEGATIVE
Trichomonas: NEGATIVE

## 2022-03-02 DIAGNOSIS — M810 Age-related osteoporosis without current pathological fracture: Secondary | ICD-10-CM | POA: Diagnosis not present

## 2022-03-10 DIAGNOSIS — M810 Age-related osteoporosis without current pathological fracture: Secondary | ICD-10-CM | POA: Diagnosis not present

## 2022-05-19 DIAGNOSIS — M810 Age-related osteoporosis without current pathological fracture: Secondary | ICD-10-CM | POA: Diagnosis not present

## 2022-08-04 DIAGNOSIS — S43005A Unspecified dislocation of left shoulder joint, initial encounter: Secondary | ICD-10-CM | POA: Diagnosis not present

## 2022-08-04 DIAGNOSIS — M79602 Pain in left arm: Secondary | ICD-10-CM | POA: Diagnosis not present

## 2022-08-04 DIAGNOSIS — S43015A Anterior dislocation of left humerus, initial encounter: Secondary | ICD-10-CM | POA: Diagnosis not present

## 2022-08-04 DIAGNOSIS — M25512 Pain in left shoulder: Secondary | ICD-10-CM | POA: Diagnosis not present

## 2022-08-04 DIAGNOSIS — S42292A Other displaced fracture of upper end of left humerus, initial encounter for closed fracture: Secondary | ICD-10-CM | POA: Diagnosis not present

## 2022-08-05 ENCOUNTER — Telehealth: Payer: Self-pay

## 2022-08-05 DIAGNOSIS — S42292A Other displaced fracture of upper end of left humerus, initial encounter for closed fracture: Secondary | ICD-10-CM | POA: Diagnosis not present

## 2022-08-05 DIAGNOSIS — S43005A Unspecified dislocation of left shoulder joint, initial encounter: Secondary | ICD-10-CM | POA: Diagnosis not present

## 2022-08-05 NOTE — Transitions of Care (Post Inpatient/ED Visit) (Signed)
   08/05/2022  Name: Julia [place] MRN: [id number] DOB: 1955/04/26  Today's TOC FU Call Status: Today's TOC FU Call Status:: Successful TOC FU Call Competed TOC FU Call Complete Date: 08/05/22  Transition Care Management Follow-up Telephone Call Date of Discharge: 08/04/22 Name of Other (Non-[facility]) Discharge Facility: [facility] Type of Discharge: Emergency Department Reason for ED Visit: Orthopedic Conditions (dislocated shoulder) Orthopedic/Injury Diagnosis:  (dislocated shoulder) How have you been since you were released from the hospital?: Better Any questions or concerns?: No  Items Reviewed: Did you receive and understand the discharge instructions provided?: Yes Medications obtained,verified, and reconciled?: Yes (Medications Reviewed) Any new allergies since your discharge?: No Dietary orders reviewed?: NA Do you have support at home?: Yes People in Home: spouse Name of Support/Comfort Primary Source: Eddie  Medications Reviewed Today: Medications Reviewed Today     Reviewed by Everitt Amber (Medical Assistant) on 08/05/22 at 1112  Med List Status: <None>   Medication Order Taking? Sig Documenting Provider Last Dose Status Informant  Calcium Carbonate-Vit D-Min (CALCIUM 1200 PO) [id number] Yes Take 1 tablet by mouth daily. [provider] Taking Active   ergocalciferol (VITAMIN D2) 1.25 MG (50000 UT) capsule [id number] Yes Take by mouth. [provider] Taking Active   fluticasone (FLONASE) 50 MCG/ACT nasal spray [id number] Yes Place 2 sprays into both nostrils daily. Domenick Gong, MD Taking Active             Home Care and Equipment/Supplies: Were Home Health Services Ordered?: NA Any new equipment or medical supplies ordered?: Yes (sling to wear) Were you able to get the equipment/medical supplies?: Yes Do you have any questions related to the use of the equipment/supplies?: No  Functional Questionnaire: Do you need assistance with  bathing/showering or dressing?: No Do you need assistance with meal preparation?: No Do you need assistance with eating?: No Do you have difficulty maintaining continence: No Do you need assistance with getting out of bed/getting out of a chair/moving?: No Do you have difficulty managing or taking your medications?: No  Follow up appointments reviewed: PCP Follow-up appointment confirmed?: Yes Date of PCP follow-up appointment?: 08/11/22 Follow-up Provider: [facility] Follow-up appointment confirmed?: NA Do you need transportation to your follow-up appointment?: No Do you understand care options if your condition(s) worsen?: Yes-patient verbalized understanding    SIGNATURE Arthur Holms

## 2022-08-05 NOTE — Transitions of Care (Post Inpatient/ED Visit) (Signed)
   08/05/2022  Name: SHANNYN [place] MRN: [id number] DOB: 1955/06/29  Today's TOC FU Call Status: Today's TOC FU Call Status:: Unsuccessul Call (1st Attempt) Unsuccessful Call (1st Attempt) Date: 08/05/22  Attempted to reach the patient regarding the most recent Inpatient/ED visit.  Follow Up Plan: Additional outreach attempts will be made to reach the patient to complete the Transitions of Care (Post Inpatient/ED visit) call.   Signature Arthur Holms

## 2022-08-11 ENCOUNTER — Ambulatory Visit (INDEPENDENT_AMBULATORY_CARE_PROVIDER_SITE_OTHER): Payer: Medicare HMO | Admitting: Family Medicine

## 2022-08-11 ENCOUNTER — Encounter: Payer: Self-pay | Admitting: Family Medicine

## 2022-08-11 VITALS — BP 118/72 | HR 60 | Ht 62.0 in | Wt 118.0 lb

## 2022-08-11 DIAGNOSIS — S43302D Subluxation of unspecified parts of left shoulder girdle, subsequent encounter: Secondary | ICD-10-CM

## 2022-08-11 DIAGNOSIS — S43302S Subluxation of unspecified parts of left shoulder girdle, sequela: Secondary | ICD-10-CM | POA: Diagnosis not present

## 2022-08-11 NOTE — Progress Notes (Signed)
Date:  08/11/2022   Name:  Julia Washington   DOB:  January 03, 1956   MRN:  [id number]   Chief Complaint: ER follow up (ER on 5/21. TOC on 08/05/22- follow up with ortho this Friday- wearing sling until then for L) shoulder)  Shoulder Injury  Incident location: at grandchild event. The left shoulder is affected. The incident occurred 5 to 7 days ago. 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 The quality of the pain is described as aching. The pain does not radiate. The pain is mild. Pertinent negatives include no chest pain.    No results found for: "NA", "K", "CO2", "GLUCOSE", "BUN", "CREATININE", "CALCIUM", "EGFR", "GFRNONAA" No results found for: "CHOL", "HDL", "LDLCALC", "LDLDIRECT", "TRIG", "CHOLHDL" No results found for: "TSH" No results found for: "HGBA1C" No results found for: "WBC", "HGB", "HCT", "MCV", "PLT" No results found for: "ALT", "AST", "GGT", "ALKPHOS", "BILITOT" No results found for: "25OHVITD2", "25OHVITD3", "VD25OH"   Review of Systems  Respiratory:  Negative for chest tightness and wheezing.   Cardiovascular:  Negative for chest pain and palpitations.  Musculoskeletal:  Positive for arthralgias. Negative for back pain, joint swelling, myalgias and neck stiffness.    There are no problems to display for this patient.   No Known Allergies  No past surgical history on file.  Social History   Tobacco Use   Smoking status: Never   Smokeless tobacco: Never  Vaping Use   Vaping Use: Never used  Substance Use Topics   Alcohol use: Not Currently   Drug use: Not Currently     Medication list has been reviewed and updated.  Current Meds  Medication Sig   Calcium Carbonate-Vit D-Min (CALCIUM 1200 PO) Take 1 tablet by mouth daily.   ergocalciferol (VITAMIN D2) 1.25 MG (50000 UT) capsule Take by mouth.   fluticasone (FLONASE) 50 MCG/ACT nasal spray Place 2 sprays into both nostrils daily.       08/11/2022    2:45 PM 06/16/2021    2:22 PM  GAD  7 : Generalized Anxiety Score  Nervous, Anxious, on Edge 0 0  Control/stop worrying 0 0  Worry too much - different things 0 0  Trouble relaxing 0 0  Restless 0 0  Easily annoyed or irritable 0 0  Afraid - awful might happen 0 0  Total GAD 7 Score 0 0  Anxiety Difficulty Not difficult at all Not difficult at all       08/11/2022    2:45 PM 06/16/2021    2:22 PM  Depression screen PHQ 2/9  Decreased Interest 0 0  Down, Depressed, Hopeless 0 0  PHQ - 2 Score 0 0  Altered sleeping 0 0  Tired, decreased energy 0 0  Change in appetite 0 0  Feeling bad or failure about yourself  0 0  Trouble concentrating 0 0  Moving slowly or fidgety/restless 0 0  Suicidal thoughts 0 0  PHQ-9 Score 0 0  Difficult doing work/chores Not difficult at all Not difficult at all    BP Readings from Last 3 Encounters:  08/11/22 118/72  10/22/21 122/70  06/16/21 118/84    Physical Exam Vitals and nursing note reviewed.  HENT:     Right Ear: Tympanic membrane and ear canal normal.     Left Ear: Tympanic membrane and ear canal normal.  Cardiovascular:     Heart sounds: No murmur heard.    No gallop.  Pulmonary:     Breath sounds: No wheezing or  rhonchi.  Musculoskeletal:     Left shoulder: Tenderness present.     Cervical back: Normal range of motion.     Comments: Tender anterior/distal clavicle  Neurological:     Mental Status: She is alert.     Wt Readings from Last 3 Encounters:  08/11/22 118 lb (53.5 kg)  10/22/21 122 lb (55.3 kg)  06/16/21 123 lb (55.8 kg)    BP 118/72   Pulse 60   Ht 5\' 2"  (1.575 m)   Wt 118 lb (53.5 kg)   SpO2 97%   BMI 21.58 kg/m   Assessment and Plan: 1. Subluxation of left shoulder girdle, sequela Chronic.  Controlled.  Stable.  Patient may have had an injury in the distant past with no issues but recently had an event that she partially subluxed her left shoulder and then was able to return to stability.  However approximately a week ago she tried to  clap over her head and there was a dislocation anteriorly with inability to spontaneously return to neutrality.  Patient was seen in the ER patient says she was able to manipulate it back into position at the ER where it has been in a sling since.  During the course it was noted that there was an impact fracture of the humerus and patient will be reevaluated by orthopedics.  On examination today she is tender over the distal clavicle and anterior aspect and is interested in physical therapy if okayed by orthopedics to build up muscle to help stabilize left shoulder girdle.  In the meantime she will continue in sling until she is reevaluated and will take her ibuprofen/Tylenol on as-needed basis.    Elizabeth Sauer, MD

## 2022-08-14 DIAGNOSIS — S43005S Unspecified dislocation of left shoulder joint, sequela: Secondary | ICD-10-CM | POA: Diagnosis not present

## 2022-08-14 DIAGNOSIS — M25512 Pain in left shoulder: Secondary | ICD-10-CM | POA: Diagnosis not present

## 2022-08-14 DIAGNOSIS — M21822 Other specified acquired deformities of left upper arm: Secondary | ICD-10-CM | POA: Diagnosis not present

## 2022-08-14 DIAGNOSIS — S43005A Unspecified dislocation of left shoulder joint, initial encounter: Secondary | ICD-10-CM | POA: Diagnosis not present

## 2022-08-14 DIAGNOSIS — S43005D Unspecified dislocation of left shoulder joint, subsequent encounter: Secondary | ICD-10-CM | POA: Diagnosis not present

## 2022-09-07 DIAGNOSIS — M24412 Recurrent dislocation, left shoulder: Secondary | ICD-10-CM | POA: Diagnosis not present

## 2022-09-07 DIAGNOSIS — M25512 Pain in left shoulder: Secondary | ICD-10-CM | POA: Diagnosis not present

## 2022-09-14 DIAGNOSIS — M24412 Recurrent dislocation, left shoulder: Secondary | ICD-10-CM | POA: Diagnosis not present

## 2022-09-14 DIAGNOSIS — M25512 Pain in left shoulder: Secondary | ICD-10-CM | POA: Diagnosis not present

## 2022-09-15 DIAGNOSIS — M810 Age-related osteoporosis without current pathological fracture: Secondary | ICD-10-CM | POA: Diagnosis not present

## 2022-09-21 DIAGNOSIS — M25512 Pain in left shoulder: Secondary | ICD-10-CM | POA: Diagnosis not present

## 2022-09-21 DIAGNOSIS — M24412 Recurrent dislocation, left shoulder: Secondary | ICD-10-CM | POA: Diagnosis not present

## 2022-09-23 DIAGNOSIS — M24412 Recurrent dislocation, left shoulder: Secondary | ICD-10-CM | POA: Diagnosis not present

## 2022-09-23 DIAGNOSIS — M25512 Pain in left shoulder: Secondary | ICD-10-CM | POA: Diagnosis not present

## 2022-09-28 DIAGNOSIS — S43005D Unspecified dislocation of left shoulder joint, subsequent encounter: Secondary | ICD-10-CM | POA: Diagnosis not present

## 2022-09-28 DIAGNOSIS — M24412 Recurrent dislocation, left shoulder: Secondary | ICD-10-CM | POA: Diagnosis not present

## 2022-09-28 DIAGNOSIS — M25512 Pain in left shoulder: Secondary | ICD-10-CM | POA: Diagnosis not present

## 2022-09-28 DIAGNOSIS — M21822 Other specified acquired deformities of left upper arm: Secondary | ICD-10-CM | POA: Diagnosis not present

## 2022-09-30 DIAGNOSIS — M25512 Pain in left shoulder: Secondary | ICD-10-CM | POA: Diagnosis not present

## 2022-09-30 DIAGNOSIS — M24412 Recurrent dislocation, left shoulder: Secondary | ICD-10-CM | POA: Diagnosis not present

## 2023-02-26 ENCOUNTER — Ambulatory Visit (INDEPENDENT_AMBULATORY_CARE_PROVIDER_SITE_OTHER): Payer: Medicare HMO | Admitting: Family Medicine

## 2023-02-26 ENCOUNTER — Encounter: Payer: Self-pay | Admitting: Family Medicine

## 2023-02-26 VITALS — BP 122/82 | HR 84 | Ht 62.0 in | Wt 119.0 lb

## 2023-02-26 DIAGNOSIS — R030 Elevated blood-pressure reading, without diagnosis of hypertension: Secondary | ICD-10-CM | POA: Diagnosis not present

## 2023-02-26 NOTE — Patient Instructions (Addendum)
Warning Signs of a Stroke A stroke is a medical emergency. It should be treated right away. A stroke happens when there is not enough blood flow to the brain. A stroke can lead to brain damage and death. But if a person gets treated right away, they have a better chance of surviving and recovering. It is very important to recognize the symptoms of a stroke. What types of strokes are there? There are two main types of strokes: Ischemic stroke. This is the most common type. It happens when a blood vessel that sends blood to the brain is blocked. Hemorrhagic stroke. This happens when there is bleeding in the brain. This may be from a blood vessel leaking or bursting. A transient ischemic attack (TIA) causes the same symptoms as a stroke. But the symptoms go away quickly and do not cause lasting damage to the brain. TIAs still need to be treated right away. They are also a sign that you are at higher risk for a stroke. What are the warning signs of a stroke? The symptoms of a stroke may differ based on the part of the brain that is involved. Symptoms often happen all of a sudden. "BE FAST" symptoms "BE FAST" is an easy way to remember the main warning signs of a stroke: B - Balance. Signs are dizziness, sudden trouble walking, or loss of balance. E - Eyes. Signs are trouble seeing or a sudden change in vision. F - Face. Signs are sudden weakness or numbness of the face, or the face or eyelid drooping on one side. A - Arms. Signs are weakness or numbness in an arm. This happens suddenly and usually on one side of the body. S - Speech. Signs are sudden trouble speaking, slurred speech, or trouble understanding what people say. T - Time. Time to call emergency services. Write down what time symptoms started. A stroke may be happening even if only one "BE FAST" symptom is present. Other signs of a stroke Some less common signs of a stroke include: A sudden, severe headache with no known cause. Nausea  or vomiting. Seizure. These symptoms may be an emergency. Get help right away. Call 911. Do not wait to see if the symptoms will go away. Do not drive yourself to the hospital.  This information is not intended to replace advice given to you by your health care provider. Make sure you discuss any questions you have with your health care provider. Document Revised: 12/15/2021 Document Reviewed: 12/15/2021 Elsevier Patient Education  2024 Elsevier Inc. How to Take Your Blood Pressure Blood pressure is a measurement of how strongly your blood is pressing against the walls of your arteries. Arteries are blood vessels that carry blood from your heart throughout your body. Your health care provider takes your blood pressure at each office visit. You can also take your own blood pressure at home with a blood pressure monitor. You may need to take your own blood pressure to: Confirm a diagnosis of high blood pressure (hypertension). Monitor your blood pressure over time. Make sure your blood pressure medicine is working. Supplies needed: Blood pressure monitor. A chair to sit in. This should be a chair where you can sit upright with your back supported. Do not sit on a soft couch or an armchair. Table or desk. Small notebook and pencil or pen. How to prepare To get the most accurate reading, avoid the following for 30 minutes before you check your blood pressure: Drinking caffeine. Drinking alcohol. Eating. Smoking.  Exercising. Five minutes before you check your blood pressure: Use the bathroom and urinate so that you have an empty bladder. Sit quietly in a chair. Do not talk. How to take your blood pressure To check your blood pressure, follow the instructions in the manual that came with your blood pressure monitor. If you have a digital blood pressure monitor, the instructions may be as follows: Sit up straight in a chair. Place your feet on the floor. Do not cross your ankles or  legs. Rest your left arm at the level of your heart on a table or desk or on the arm of a chair. Pull up your shirt sleeve. Wrap the blood pressure cuff around the upper part of your left arm, 1 inch (2.5 cm) above your elbow. It is best to wrap the cuff around bare skin. Fit the cuff snugly, but not too tightly, around your arm. You should be able to place only one finger between the cuff and your arm. Position the cord so that it rests in the bend of your elbow. Press the power button. Sit quietly while the cuff inflates and deflates. Read the digital reading on the monitor screen and write the numbers down (record them) in a notebook. Wait 2-3 minutes, then repeat the steps, starting at step 1. What does my blood pressure reading mean? A blood pressure reading consists of a higher number over a lower number. Ideally, your blood pressure should be below 120/80. The first ("top") number is called the systolic pressure. It is a measure of the pressure in your arteries as your heart beats. The second ("bottom") number is called the diastolic pressure. It is a measure of the pressure in your arteries as the heart relaxes. Blood pressure is classified into four stages. The following are the stages for adults who do not have a short-term serious illness or a chronic condition. Systolic pressure and diastolic pressure are measured in a unit called mm Hg (millimeters of mercury).  Normal Systolic pressure: below 120. Diastolic pressure: below 80. Elevated Systolic pressure: 120-129. Diastolic pressure: below 80. Hypertension stage 1 Systolic pressure: 130-139. Diastolic pressure: 80-89. Hypertension stage 2 Systolic pressure: 140 or above. Diastolic pressure: 90 or above. You can have elevated blood pressure or hypertension even if only the systolic or only the diastolic number in your reading is higher than normal. Follow these instructions at home: Medicines Take over-the-counter and  prescription medicines only as told by your health care provider. Tell your health care provider if you are having any side effects from blood pressure medicine. General instructions Check your blood pressure as often as recommended by your health care provider. Check your blood pressure at the same time every day. Take your monitor to the next appointment with your health care provider to make sure that: You are using it correctly. It provides accurate readings. Understand what your goal blood pressure numbers are. Keep all follow-up visits. This is important. General tips Your health care provider can suggest a reliable monitor that will meet your needs. There are several types of home blood pressure monitors. Choose a monitor that has an arm cuff. Do not choose a monitor that measures your blood pressure from your wrist or finger. Choose a cuff that wraps snugly, not too tight or too loose, around your upper arm. You should be able to fit only one finger between your arm and the cuff. You can buy a blood pressure monitor at most drugstores or online. Where to find  more information American Heart Association: www.heart.org Contact a health care provider if: Your blood pressure is consistently high. Your blood pressure is suddenly low. Get help right away if: Your systolic blood pressure is higher than 180. Your diastolic blood pressure is higher than 120. These symptoms may be an emergency. Get help right away. Call 911. Do not wait to see if the symptoms will go away. Do not drive yourself to the hospital. Summary Blood pressure is a measurement of how strongly your blood is pressing against the walls of your arteries. A blood pressure reading consists of a higher number over a lower number. Ideally, your blood pressure should be below 120/80. Check your blood pressure at the same time every day. Avoid caffeine, alcohol, smoking, and exercise for 30 minutes prior to checking your  blood pressure. These agents can affect the accuracy of the blood pressure reading. This information is not intended to replace advice given to you by your health care provider. Make sure you discuss any questions you have with your health care provider. Document Revised: 11/14/2020 Document Reviewed: 11/14/2020 Elsevier Patient Education  2024 [employer].

## 2023-02-26 NOTE — Progress Notes (Signed)
Date:  02/26/2023   Name:  Julia Washington   DOB:  10/09/1955   MRN:  [id number]   Chief Complaint: Hypertension (Patient here for a b/p check. )  Hypertension Chronicity: Normal blood pressure is baseline. The problem is unchanged. The problem is controlled. Pertinent negatives include no anxiety, blurred vision, chest pain, headaches, malaise/fatigue, neck pain, orthopnea, palpitations, peripheral edema, PND, shortness of breath or sweats. There are no associated agents to hypertension. There are no known risk factors for coronary artery disease. Past treatments include nothing. There are no compliance problems.     No results found for: "NA", "K", "CO2", "GLUCOSE", "BUN", "CREATININE", "CALCIUM", "EGFR", "GFRNONAA" No results found for: "CHOL", "HDL", "LDLCALC", "LDLDIRECT", "TRIG", "CHOLHDL" No results found for: "TSH" No results found for: "HGBA1C" No results found for: "WBC", "HGB", "HCT", "MCV", "PLT" No results found for: "ALT", "AST", "GGT", "ALKPHOS", "BILITOT" No results found for: "25OHVITD2", "25OHVITD3", "VD25OH"   Review of Systems  Constitutional:  Negative for malaise/fatigue and unexpected weight change.  Eyes:  Negative for blurred vision.  Respiratory:  Negative for cough, choking, shortness of breath, wheezing and stridor.   Cardiovascular:  Negative for chest pain, palpitations, orthopnea and PND.  Gastrointestinal:  Negative for abdominal distention.  Musculoskeletal:  Negative for neck pain.  Neurological:  Negative for light-headedness and headaches.    There are no active problems to display for this patient.   No Known Allergies  History reviewed. No pertinent surgical history.  Social History   Tobacco Use   Smoking status: Never   Smokeless tobacco: Never  Vaping Use   Vaping status: Never Used  Substance Use Topics   Alcohol use: Not Currently   Drug use: Not Currently     Medication list has been reviewed and updated.  Current Meds   Medication Sig   Calcium Carbonate-Vit D-Min (CALCIUM 1200 PO) Take 1 tablet by mouth daily.   ergocalciferol (VITAMIN D2) 1.25 MG (50000 UT) capsule Take by mouth.   fluticasone (FLONASE) 50 MCG/ACT nasal spray Place 2 sprays into both nostrils daily.       02/26/2023    2:27 PM 08/11/2022    2:45 PM 06/16/2021    2:22 PM  GAD 7 : Generalized Anxiety Score  Nervous, Anxious, on Edge 0 0 0  Control/stop worrying 0 0 0  Worry too much - different things 1 0 0  Trouble relaxing 0 0 0  Restless 1 0 0  Easily annoyed or irritable 0 0 0  Afraid - awful might happen 0 0 0  Total GAD 7 Score 2 0 0  Anxiety Difficulty Not difficult at all Not difficult at all Not difficult at all       02/26/2023    2:26 PM 08/11/2022    2:45 PM 06/16/2021    2:22 PM  Depression screen PHQ 2/9  Decreased Interest 0 0 0  Down, Depressed, Hopeless  0 0  PHQ - 2 Score 0 0 0  Altered sleeping 1 0 0  Tired, decreased energy 1 0 0  Change in appetite 0 0 0  Feeling bad or failure about yourself  0 0 0  Trouble concentrating 0 0 0  Moving slowly or fidgety/restless 0 0 0  Suicidal thoughts 0 0 0  PHQ-9 Score 2 0 0  Difficult doing work/chores Not difficult at all Not difficult at all Not difficult at all    BP Readings from Last 3 Encounters:  02/26/23 122/82  08/11/22  118/72  10/22/21 122/70    Physical Exam Vitals and nursing note reviewed.  Constitutional:      Appearance: She is well-developed.  HENT:     Head: Normocephalic.     Right Ear: External ear normal.     Left Ear: External ear normal.  Eyes:     General: Lids are everted, no foreign bodies appreciated. No scleral icterus.       Left eye: No foreign body or hordeolum.     Conjunctiva/sclera: Conjunctivae normal.     Right eye: Right conjunctiva is not injected.     Left eye: Left conjunctiva is not injected.     Pupils: Pupils are equal, round, and reactive to light.  Neck:     Thyroid: No thyromegaly.     Vascular: No  JVD.     Trachea: No tracheal deviation.  Cardiovascular:     Rate and Rhythm: Normal rate and regular rhythm.     Heart sounds: Normal heart sounds. No murmur heard.    No friction rub. No gallop.  Pulmonary:     Effort: Pulmonary effort is normal. No respiratory distress.     Breath sounds: Normal breath sounds. No wheezing, rhonchi or rales.  Abdominal:     General: Bowel sounds are normal.     Palpations: Abdomen is soft. There is no mass.     Tenderness: There is no abdominal tenderness. There is no guarding or rebound.  Musculoskeletal:        General: No tenderness. Normal range of motion.     Cervical back: Normal range of motion and neck supple.  Lymphadenopathy:     Cervical: No cervical adenopathy.  Skin:    General: Skin is warm.     Findings: No rash.  Neurological:     Mental Status: She is alert and oriented to person, place, and time.     Cranial Nerves: Cranial nerves 2-12 are intact. No cranial nerve deficit.     Sensory: Sensation is intact.     Motor: Motor function is intact.     Deep Tendon Reflexes: Reflexes normal.     Reflex Scores:      Tricep reflexes are 2+ on the right side and 2+ on the left side.      Bicep reflexes are 2+ on the right side and 2+ on the left side.      Brachioradialis reflexes are 2+ on the right side and 2+ on the left side.      Patellar reflexes are 2+ on the right side and 2+ on the left side. Psychiatric:        Mood and Affect: Mood is not anxious or depressed.     Wt Readings from Last 3 Encounters:  02/26/23 119 lb (54 kg)  08/11/22 118 lb (53.5 kg)  10/22/21 122 lb (55.3 kg)    BP 122/82   Pulse 84   Ht 5\' 2"  (1.575 m)   Wt 119 lb (54 kg)   SpO2 96%   BMI 21.77 kg/m   Assessment and Plan:  1. Elevated blood pressure reading in office without diagnosis of hypertension (Primary) Patient had an episode at home in which she had an unusual feeling involving the head and her overall disposition and felt as if  this could possibly have been a stroke although it was not consistent with any of the be fast symptomatology and gradually resolved on its own patient came in for [place] to recheck.  Blood pressure  has been elevated in the past but it was noted to be 122/82 today.  Patient will be getting a new blood pressure cuff that is not of a wrist variety and I have given her instructions on how to obtain proper blood pressure readings as well as given her signs and symptoms of strokes.   Elizabeth Sauer, MD

## 2023-04-21 DIAGNOSIS — M810 Age-related osteoporosis without current pathological fracture: Secondary | ICD-10-CM | POA: Diagnosis not present

## 2023-04-23 DIAGNOSIS — M810 Age-related osteoporosis without current pathological fracture: Secondary | ICD-10-CM | POA: Diagnosis not present

## 2023-05-12 DIAGNOSIS — M810 Age-related osteoporosis without current pathological fracture: Secondary | ICD-10-CM | POA: Diagnosis not present

## 2023-06-24 ENCOUNTER — Telehealth: Payer: Self-pay | Admitting: Family Medicine

## 2023-06-24 NOTE — Telephone Encounter (Signed)
 Copied from CRM [id number]. Topic: Medicare AWV >> Jun 24, 2023  1:55 PM Payton Doughty wrote: Reason for CRM: Called LVM 06/24/2023 to schedule AWV. Please schedule Virtual or Telehealth visits ONLY.   Verlee Rossetti; Care Guide Ambulatory Clinical Support  l [facility] Direct Dial: [phone]
